# Patient Record
Sex: Male | Born: 1935 | Race: White | Hispanic: No | Marital: Married | Smoking: Former smoker
Health system: Southern US, Community
[De-identification: ages and names within clinical notes are randomized; demographics above are authoritative.]

## PROBLEM LIST (undated history)

## (undated) DIAGNOSIS — N2581 Secondary hyperparathyroidism of renal origin: Secondary | ICD-10-CM

## (undated) DIAGNOSIS — F419 Anxiety disorder, unspecified: Secondary | ICD-10-CM

## (undated) DIAGNOSIS — E1121 Type 2 diabetes mellitus with diabetic nephropathy: Secondary | ICD-10-CM

## (undated) DIAGNOSIS — E119 Type 2 diabetes mellitus without complications: Secondary | ICD-10-CM

## (undated) DIAGNOSIS — D649 Anemia, unspecified: Secondary | ICD-10-CM

## (undated) DIAGNOSIS — E785 Hyperlipidemia, unspecified: Secondary | ICD-10-CM

## (undated) DIAGNOSIS — I2510 Atherosclerotic heart disease of native coronary artery without angina pectoris: Secondary | ICD-10-CM

## (undated) DIAGNOSIS — N186 End stage renal disease: Secondary | ICD-10-CM

## (undated) DIAGNOSIS — I10 Essential (primary) hypertension: Secondary | ICD-10-CM

## (undated) DIAGNOSIS — I509 Heart failure, unspecified: Secondary | ICD-10-CM

## (undated) DIAGNOSIS — N189 Chronic kidney disease, unspecified: Secondary | ICD-10-CM

## (undated) DIAGNOSIS — D638 Anemia in other chronic diseases classified elsewhere: Secondary | ICD-10-CM

## (undated) HISTORY — PX: OTHER SURGICAL HISTORY: SHX169

## (undated) HISTORY — DX: Type 2 diabetes mellitus without complications: E11.9

## (undated) HISTORY — DX: Essential (primary) hypertension: I10

## (undated) HISTORY — PX: CORONARY ARTERY BYPASS GRAFT: SHX141

## (undated) HISTORY — DX: Hyperlipidemia, unspecified: E78.5

## (undated) HISTORY — DX: Heart failure, unspecified: I50.9

---

## 1995-09-21 DIAGNOSIS — Z951 Presence of aortocoronary bypass graft: Secondary | ICD-10-CM

## 1995-09-21 HISTORY — DX: Presence of aortocoronary bypass graft: Z95.1

## 1995-09-21 HISTORY — PX: CORONARY ARTERY BYPASS GRAFT: SHX141

## 2013-12-06 ENCOUNTER — Ambulatory Visit: Payer: Self-pay | Admitting: Internal Medicine

## 2013-12-21 ENCOUNTER — Ambulatory Visit: Payer: Self-pay | Admitting: Internal Medicine

## 2014-08-30 DIAGNOSIS — E785 Hyperlipidemia, unspecified: Secondary | ICD-10-CM | POA: Insufficient documentation

## 2014-08-30 DIAGNOSIS — R809 Proteinuria, unspecified: Secondary | ICD-10-CM | POA: Insufficient documentation

## 2016-06-11 DIAGNOSIS — N186 End stage renal disease: Secondary | ICD-10-CM | POA: Insufficient documentation

## 2016-06-11 DIAGNOSIS — D638 Anemia in other chronic diseases classified elsewhere: Secondary | ICD-10-CM | POA: Insufficient documentation

## 2016-06-11 DIAGNOSIS — E1121 Type 2 diabetes mellitus with diabetic nephropathy: Secondary | ICD-10-CM | POA: Insufficient documentation

## 2016-06-11 DIAGNOSIS — E1122 Type 2 diabetes mellitus with diabetic chronic kidney disease: Secondary | ICD-10-CM | POA: Insufficient documentation

## 2018-02-14 ENCOUNTER — Encounter (INDEPENDENT_AMBULATORY_CARE_PROVIDER_SITE_OTHER): Payer: Self-pay | Admitting: Vascular Surgery

## 2018-02-14 ENCOUNTER — Other Ambulatory Visit (INDEPENDENT_AMBULATORY_CARE_PROVIDER_SITE_OTHER): Payer: Self-pay | Admitting: Vascular Surgery

## 2018-02-14 ENCOUNTER — Ambulatory Visit (INDEPENDENT_AMBULATORY_CARE_PROVIDER_SITE_OTHER): Payer: Medicare Other | Admitting: Vascular Surgery

## 2018-02-14 ENCOUNTER — Encounter (INDEPENDENT_AMBULATORY_CARE_PROVIDER_SITE_OTHER): Payer: Self-pay

## 2018-02-14 VITALS — BP 195/91 | HR 65 | Resp 17 | Ht 68.0 in | Wt 183.0 lb

## 2018-02-14 DIAGNOSIS — I2510 Atherosclerotic heart disease of native coronary artery without angina pectoris: Secondary | ICD-10-CM | POA: Insufficient documentation

## 2018-02-14 DIAGNOSIS — N189 Chronic kidney disease, unspecified: Secondary | ICD-10-CM

## 2018-02-14 DIAGNOSIS — E785 Hyperlipidemia, unspecified: Secondary | ICD-10-CM

## 2018-02-14 DIAGNOSIS — I10 Essential (primary) hypertension: Secondary | ICD-10-CM | POA: Diagnosis not present

## 2018-02-14 NOTE — Progress Notes (Signed)
Subjective:    Patient ID: Wesley Henderson, male    DOB: [birth year], 84 y.o.   MRN: [id number] Chief Complaint  Patient presents with  . New Patient (Initial Visit)    PD cath placement   Presents as a new patient referred by Dr. Inocente Salles for evaluation of placement of a peritoneal dialysis catheter.  The patient is seen with his wife.  The patient and his wife state that they were told he will need dialysis as soon as possible.  The patient denies any uremic symptoms at this point.  The patient and his wife have decided to proceed with peritoneal dialysis.  The patient states he will start taking peritoneal dialysis classes once the peritoneal dialysis catheter is in place.  The patient denies any recent illness.  The patient denies any recent medication changes.  The patient denies any chest pain or shortness of breath.  The patient denies any abdominal pain, nausea, vomiting or fever.  At this time, the patient is has not started dialysis.   Review of Systems  Constitutional: Negative.   HENT: Negative.   Eyes: Negative.   Respiratory: Negative.   Cardiovascular: Negative.   Gastrointestinal: Negative.   Endocrine: Negative.   Genitourinary:       Chronic kidney disease  Musculoskeletal: Negative.   Skin: Negative.   Allergic/Immunologic: Negative.   Neurological: Negative.   Hematological: Negative.   Psychiatric/Behavioral: Negative.       Objective:   Physical Exam  Constitutional: He is oriented to person, place, and time. He appears well-developed. No distress.  HENT:  Head: Normocephalic and atraumatic.  Eyes: Conjunctivae are normal. Pupils are equal, round, and reactive to light.  Neck: Normal range of motion.  Cardiovascular: Normal rate, regular rhythm, normal heart sounds and intact distal pulses.  Pulses:      Radial pulses are 2+ on the right side, and 2+ on the left side.       Dorsalis pedis pulses are 2+ on the right side, and 2+ on the left side.   Posterior tibial pulses are 2+ on the right side, and 2+ on the left side.  Pulmonary/Chest: Effort normal and breath sounds normal. No respiratory distress. He has no wheezes. He has no rales.  Abdominal: Soft. Bowel sounds are normal. He exhibits no distension. There is no tenderness. There is no rebound.  Musculoskeletal: Normal range of motion. He exhibits no edema.  Neurological: He is alert and oriented to person, place, and time.  Skin: Skin is warm and dry. He is not diaphoretic.  Psychiatric: He has a normal mood and affect. His behavior is normal. Judgment and thought content normal.  Vitals reviewed.  BP (!) 195/91 (BP Location: Left Arm, Patient Position: Sitting)   Pulse 65   Resp 17   Ht 5\' 8"  (1.727 m)   Wt 183 lb (83 kg)   BMI 27.83 kg/m   Past Medical History:  Diagnosis Date  . CHF (congestive heart failure) ([place])   . Diabetes mellitus without complication ([place])    Pre-Diabetic  . Hyperlipidemia   . Hypertension    Social History   Socioeconomic History  . Marital status: Married    Spouse name: Not on file  . Number of children: Not on file  . Years of education: Not on file  . Highest education level: Not on file  Social Needs  . Financial resource strain: Not on file  . Food insecurity - worry: Not on file  .  Food insecurity - inability: Not on file  . Transportation needs - medical: Not on file  . Transportation needs - non-medical: Not on file  Occupational History  . Not on file  Tobacco Use  . Smoking status: Never Smoker  . Smokeless tobacco: Never Used  Substance and Sexual Activity  . Alcohol use: No    Frequency: Never  . Drug use: Not on file  . Sexual activity: Not on file  Other Topics Concern  . Not on file  Social History Narrative  . Not on file   Past Surgical History:  Procedure Laterality Date  . heart bypass      History reviewed. No pertinent family history.  No Known Allergies     Assessment & Plan:  Presents as  a new patient referred by Dr. Inocente Salles for evaluation of placement of a peritoneal dialysis catheter.  The patient is seen with his wife.  The patient and his wife state that they were told he will need dialysis as soon as possible.  The patient denies any uremic symptoms at this point.  The patient and his wife have decided to proceed with peritoneal dialysis.  The patient states he will start taking peritoneal dialysis classes once the peritoneal dialysis catheter is in place.  The patient denies any recent illness.  The patient denies any recent medication changes.  The patient denies any chest pain or shortness of breath.  The patient denies any abdominal pain, nausea, vomiting or fever.  At this time, the patient is has not started dialysis.  1. Chronic kidney disease, unspecified CKD stage - New The patient will need a permanent dialysis access in the near future The patient and his wife have decided on proceeding with peritoneal dialysis Procedure, risks and benefits were explained to the patient All questions were answered The patient and his wife are willing to proceed The patient denies any new/recent health issues or illnesses The patient's medication list was updated The patient was a client was unremarkable The patient's medication list was updated  2. Essential hypertension - Stable Encouraged good control as its slows the progression of atherosclerotic disease  3. Hyperlipidemia, unspecified hyperlipidemia type - Stable Encouraged good control as its slows the progression of atherosclerotic disease  Current Outpatient Medications on File Prior to Visit  Medication Sig Dispense Refill  . amLODipine (NORVASC) 5 MG tablet Take by mouth.    . carvedilol (COREG) 12.5 MG tablet Take by mouth.    . Coenzyme Q10 (CO Q-10) 100 MG CAPS Take by mouth.    . ferrous sulfate 325 (65 FE) MG tablet Take by mouth.    . folic acid (FOLVITE) 595 MCG tablet Take by mouth.    . furosemide (LASIX)  40 MG tablet Take by mouth.    . hydrALAZINE (APRESOLINE) 25 MG tablet Take by mouth.    . losartan (COZAAR) 50 MG tablet     . MAGNESIUM PO Take by mouth.    . Omega-3 Fatty Acids (FISH OIL PO) Take by mouth.    . pyridOXINE (VITAMIN B-6) 100 MG tablet Take by mouth.    . RA KRILL OIL 500 MG CAPS Take by mouth.    . Saw Palmetto 450 MG CAPS Take by mouth.    . simvastatin (ZOCOR) 20 MG tablet     . zinc gluconate 50 MG tablet Take by mouth.     No current facility-administered medications on file prior to visit.    There are no  Patient Instructions on file for this visit. No Follow-up on file.  Tandrea Kommer A Ashli Selders, PA-C

## 2018-02-21 ENCOUNTER — Telehealth (INDEPENDENT_AMBULATORY_CARE_PROVIDER_SITE_OTHER): Payer: Self-pay

## 2018-02-21 NOTE — Telephone Encounter (Signed)
Patient's wife called stating that the patient has "white coat syndrome", the patient has high blood pressure and takes medication for this. The patient has a surgery coming up and is concerned with being put to sleep due to his bp being high. I explained that he may need to contact his PCP regarding his BP and that they always check BP before surgery as well as at his pre-op.

## 2018-02-25 ENCOUNTER — Encounter
Admission: RE | Admit: 2018-02-25 | Discharge: 2018-02-25 | Disposition: A | Discharge status: Home / Self Care | Payer: Medicare Other | Source: Ambulatory Visit | Attending: Vascular Surgery | Admitting: Vascular Surgery

## 2018-02-25 ENCOUNTER — Other Ambulatory Visit: Payer: Self-pay

## 2018-02-25 DIAGNOSIS — I4510 Unspecified right bundle-branch block: Secondary | ICD-10-CM | POA: Diagnosis not present

## 2018-02-25 DIAGNOSIS — Z01818 Encounter for other preprocedural examination: Secondary | ICD-10-CM | POA: Insufficient documentation

## 2018-02-25 DIAGNOSIS — N186 End stage renal disease: Secondary | ICD-10-CM | POA: Insufficient documentation

## 2018-02-25 DIAGNOSIS — R9431 Abnormal electrocardiogram [ECG] [EKG]: Secondary | ICD-10-CM | POA: Diagnosis not present

## 2018-02-25 HISTORY — DX: Chronic kidney disease, unspecified: N18.9

## 2018-02-25 HISTORY — DX: Anxiety disorder, unspecified: F41.9

## 2018-02-25 HISTORY — DX: Anemia, unspecified: D64.9

## 2018-02-25 LAB — PROTIME-INR
INR: 1.05
PROTHROMBIN TIME: 13.6 s (ref 11.4–15.2)

## 2018-02-25 LAB — APTT: APTT: 31 s (ref 24–36)

## 2018-02-25 NOTE — Pre-Procedure Instructions (Addendum)
Previous RBBB noted on this EKG below which is more recent than the EKG used to compare for today 02/25/18.      ECG 12-lead9/22/2016 [place] Component Name Value Ref Range  Vent Rate (bpm) 55   PR Interval (msec) 216   QRS Interval (msec) 148   QT Interval (msec) 462   QTc (msec) 441   Other Result Information  This result has an attachment that is not available.  Result Narrative  Sinus bradycardia 1st degree AV block Right bundle branch block Possible Inferior infarct , age undetermined Abnormal ECG No previous ECGs available I reviewed and concur with this report. Electronically signed IF:BPPHKF MD, Hewitt Blade [phone]) on 09/20/2015 8:45:51 AM

## 2018-02-25 NOTE — Pre-Procedure Instructions (Addendum)
PATIENT JUST HAD LABS DRAWN IN January NOTED IN CARE EVERYWHERE. DID NOT REPEAT BMP OR CBC. NOTIFIED STAFF AT [facility], message left on nurses line.

## 2018-02-25 NOTE — Patient Instructions (Signed)
Your procedure is scheduled on: Friday 03/04/18 Report to [place]. To find out your arrival time please call [phone] between 1PM - 3PM on Thursday 03/03/18.  Remember: Instructions that are not followed completely may result in serious medical risk, up to and including death, or upon the discretion of your surgeon and anesthesiologist your surgery may need to be rescheduled.     _X__ 1. Do not eat food after midnight the night before your procedure.                 No gum chewing or hard candies. You may drink clear liquids up to 2 hours                 before you are scheduled to arrive for your surgery- DO not drink clear                 liquids within 2 hours of the start of your surgery.                 Clear Liquids include:  water, apple juice without pulp, clear carbohydrate                 drink such as Clearfast or Gatorade, Black Coffee or Tea (Do not add                 anything to coffee or tea).  __X__2.  On the morning of surgery brush your teeth with toothpaste and water, you                 may rinse your mouth with mouthwash if you wish.  Do not swallow any              toothpaste of mouthwash.     _X__ 3.  No Alcohol for 24 hours before or after surgery.   _X__ 4.  Do Not Smoke or use e-cigarettes For 24 Hours Prior to Your Surgery.                 Do not use any chewable tobacco products for at least 6 hours prior to                 surgery.  ____  5.  Bring all medications with you on the day of surgery if instructed.   __X__  6.  Notify your doctor if there is any change in your medical condition      (cold, fever, infections).     Do not wear jewelry, make-up, hairpins, clips or nail polish. Do not wear lotions, powders, or perfumes.  Do not shave 48 hours prior to surgery. Men may shave face and neck. Do not bring valuables to the hospital.    [facility] is not responsible for any belongings or  valuables.  Contacts, dentures/partials or body piercings may not be worn into surgery. Bring a case for your contacts, glasses or hearing aids, a denture cup will be supplied. Leave your suitcase in the car. After surgery it may be brought to your room. For patients admitted to the hospital, discharge time is determined by your treatment team.   Patients discharged the day of surgery will not be allowed to drive home.   Please read over the following fact sheets that you were given:   MRSA Information  __X__ Take these medicines the morning of surgery with A SIP OF WATER:  1. AMLODIPINE  2. CARVEDILOL  3.   4.  5.  6. YOU MAY CONTINUE ASPIRIN UP UNTIL THE DAY BEFORE SURGERY  ____ Fleet Enema (as directed)   __X__ Use CHG Soap/SAGE wipes as directed  ____ Use inhalers on the day of surgery  ____ Stop metformin/Janumet/Farxiga 2 days prior to surgery    ____ Take 1/2 of usual insulin dose the night before surgery. No insulin the morning          of surgery.   ____ Stop Blood Thinners Coumadin/Plavix/Xarelto/Pleta/Pradaxa/Eliquis/Effient/Aspirin  on   Or contact your Surgeon, Cardiologist or Medical Doctor regarding  ability to stop your blood thinners  __X__ Stop Anti-inflammatories 7 days before surgery such as Advil, Ibuprofen, Motrin,  BC or Goodies Powder, Naprosyn, Naproxen, Aleve, Aspirin  YOU MAY TAKE TYLENOL   __X__ Stop all herbal supplements, fish oil or vitamin E until after surgery.  STOP CO Q 10 AND FISH OIL TODAY  ____ Bring C-Pap to the hospital.

## 2018-02-26 LAB — TYPE AND SCREEN
ABO/RH(D): O NEG
Antibody Screen: NEGATIVE

## 2018-03-03 MED ORDER — CEFAZOLIN SODIUM-DEXTROSE 2-4 GM/100ML-% IV SOLN
2.0000 g | INTRAVENOUS | Status: AC
  Administered 2018-03-04: 2 g via INTRAVENOUS

## 2018-03-04 ENCOUNTER — Ambulatory Visit: Payer: Medicare Other | Admitting: Anesthesiology

## 2018-03-04 ENCOUNTER — Ambulatory Visit
Admission: RE | Admit: 2018-03-04 | Discharge: 2018-03-04 | Disposition: A | Discharge status: Home / Self Care | Payer: Medicare Other | Source: Ambulatory Visit | Attending: Vascular Surgery | Admitting: Vascular Surgery

## 2018-03-04 ENCOUNTER — Encounter
Admission: RE | Disposition: A | Discharge status: Home / Self Care | Payer: Self-pay | Source: Ambulatory Visit | Attending: Vascular Surgery

## 2018-03-04 ENCOUNTER — Encounter: Payer: Self-pay | Admitting: *Deleted

## 2018-03-04 DIAGNOSIS — I2510 Atherosclerotic heart disease of native coronary artery without angina pectoris: Secondary | ICD-10-CM | POA: Insufficient documentation

## 2018-03-04 DIAGNOSIS — E785 Hyperlipidemia, unspecified: Secondary | ICD-10-CM | POA: Diagnosis not present

## 2018-03-04 DIAGNOSIS — Z888 Allergy status to other drugs, medicaments and biological substances status: Secondary | ICD-10-CM | POA: Diagnosis not present

## 2018-03-04 DIAGNOSIS — N186 End stage renal disease: Secondary | ICD-10-CM

## 2018-03-04 DIAGNOSIS — Z7982 Long term (current) use of aspirin: Secondary | ICD-10-CM | POA: Diagnosis not present

## 2018-03-04 DIAGNOSIS — F419 Anxiety disorder, unspecified: Secondary | ICD-10-CM | POA: Diagnosis not present

## 2018-03-04 DIAGNOSIS — I132 Hypertensive heart and chronic kidney disease with heart failure and with stage 5 chronic kidney disease, or end stage renal disease: Secondary | ICD-10-CM | POA: Diagnosis not present

## 2018-03-04 DIAGNOSIS — E1122 Type 2 diabetes mellitus with diabetic chronic kidney disease: Secondary | ICD-10-CM | POA: Diagnosis present

## 2018-03-04 DIAGNOSIS — I509 Heart failure, unspecified: Secondary | ICD-10-CM | POA: Diagnosis not present

## 2018-03-04 DIAGNOSIS — Z79899 Other long term (current) drug therapy: Secondary | ICD-10-CM | POA: Insufficient documentation

## 2018-03-04 HISTORY — PX: CAPD INSERTION: SHX5233

## 2018-03-04 LAB — POCT I-STAT 4, (NA,K, GLUC, HGB,HCT)
Glucose, Bld: 127 mg/dL — ABNORMAL HIGH (ref 65–99)
HEMATOCRIT: 24 % — AB (ref 39.0–52.0)
Hemoglobin: 8.2 g/dL — ABNORMAL LOW (ref 13.0–17.0)
Potassium: 4.5 mmol/L (ref 3.5–5.1)
SODIUM: 140 mmol/L (ref 135–145)

## 2018-03-04 LAB — GLUCOSE, CAPILLARY: GLUCOSE-CAPILLARY: 116 mg/dL — AB (ref 65–99)

## 2018-03-04 SURGERY — LAPAROSCOPIC INSERTION CONTINUOUS AMBULATORY PERITONEAL DIALYSIS  (CAPD) CATHETER
Anesthesia: General | Wound class: Clean

## 2018-03-04 MED ORDER — FENTANYL CITRATE (PF) 100 MCG/2ML IJ SOLN
INTRAMUSCULAR | Status: DC | PRN
  Administered 2018-03-04: 100 ug via INTRAVENOUS

## 2018-03-04 MED ORDER — SODIUM CHLORIDE 0.9 % IV SOLN
INTRAVENOUS | Status: DC
  Administered 2018-03-04: 07:00:00 via INTRAVENOUS

## 2018-03-04 MED ORDER — FENTANYL CITRATE (PF) 100 MCG/2ML IJ SOLN: 25.0000 ug | INTRAMUSCULAR | Status: DC | PRN

## 2018-03-04 MED ORDER — PROMETHAZINE HCL 25 MG/ML IJ SOLN: 6.2500 mg | INTRAMUSCULAR | Status: DC | PRN

## 2018-03-04 MED ORDER — SUGAMMADEX SODIUM 200 MG/2ML IV SOLN
INTRAVENOUS | Status: AC
  Filled 2018-03-04: qty 2

## 2018-03-04 MED ORDER — PHENYLEPHRINE HCL 10 MG/ML IJ SOLN
INTRAMUSCULAR | Status: AC
  Filled 2018-03-04: qty 1

## 2018-03-04 MED ORDER — CHLORHEXIDINE GLUCONATE CLOTH 2 % EX PADS: 6.0000 | MEDICATED_PAD | Freq: Once | CUTANEOUS | Status: DC

## 2018-03-04 MED ORDER — PROPOFOL 10 MG/ML IV BOLUS
INTRAVENOUS | Status: DC | PRN
  Administered 2018-03-04: 130 mg via INTRAVENOUS

## 2018-03-04 MED ORDER — SUGAMMADEX SODIUM 200 MG/2ML IV SOLN
INTRAVENOUS | Status: DC | PRN
  Administered 2018-03-04: 170 mg via INTRAVENOUS

## 2018-03-04 MED ORDER — OXYCODONE HCL 5 MG PO TABS: 5.0000 mg | ORAL_TABLET | Freq: Once | ORAL | Status: DC | PRN

## 2018-03-04 MED ORDER — OXYCODONE HCL 5 MG/5ML PO SOLN: 5.0000 mg | Freq: Once | ORAL | Status: DC | PRN

## 2018-03-04 MED ORDER — PROPOFOL 10 MG/ML IV BOLUS
INTRAVENOUS | Status: AC
  Filled 2018-03-04: qty 20

## 2018-03-04 MED ORDER — CEFAZOLIN SODIUM-DEXTROSE 2-4 GM/100ML-% IV SOLN
INTRAVENOUS | Status: AC
  Filled 2018-03-04: qty 100

## 2018-03-04 MED ORDER — BUPIVACAINE-EPINEPHRINE (PF) 0.25% -1:200000 IJ SOLN
INTRAMUSCULAR | Status: DC | PRN
  Administered 2018-03-04: 20 mL via PERINEURAL

## 2018-03-04 MED ORDER — SEVOFLURANE IN SOLN
RESPIRATORY_TRACT | Status: AC
  Filled 2018-03-04: qty 250

## 2018-03-04 MED ORDER — ONDANSETRON HCL 4 MG/2ML IJ SOLN
INTRAMUSCULAR | Status: AC
  Filled 2018-03-04: qty 2

## 2018-03-04 MED ORDER — ONDANSETRON HCL 4 MG/2ML IJ SOLN
INTRAMUSCULAR | Status: DC | PRN
  Administered 2018-03-04: 4 mg via INTRAVENOUS

## 2018-03-04 MED ORDER — HYDROCODONE-ACETAMINOPHEN 5-325 MG PO TABS: 1.0000 | ORAL_TABLET | Freq: Four times a day (QID) | ORAL | 0 refills | Status: DC | PRN

## 2018-03-04 MED ORDER — EPHEDRINE SULFATE 50 MG/ML IJ SOLN
INTRAMUSCULAR | Status: DC | PRN
  Administered 2018-03-04: 10 mg via INTRAVENOUS

## 2018-03-04 MED ORDER — FAMOTIDINE 20 MG PO TABS
ORAL_TABLET | ORAL | Status: AC
  Administered 2018-03-04: 20 mg via ORAL
  Filled 2018-03-04: qty 1

## 2018-03-04 MED ORDER — EPHEDRINE SULFATE 50 MG/ML IJ SOLN
INTRAMUSCULAR | Status: AC
  Filled 2018-03-04: qty 1

## 2018-03-04 MED ORDER — FENTANYL CITRATE (PF) 100 MCG/2ML IJ SOLN
INTRAMUSCULAR | Status: AC
  Filled 2018-03-04: qty 2

## 2018-03-04 MED ORDER — FAMOTIDINE 20 MG PO TABS
20.0000 mg | ORAL_TABLET | Freq: Once | ORAL | Status: AC
  Administered 2018-03-04: 20 mg via ORAL

## 2018-03-04 MED ORDER — ROCURONIUM BROMIDE 100 MG/10ML IV SOLN
INTRAVENOUS | Status: DC | PRN
  Administered 2018-03-04: 20 mg via INTRAVENOUS
  Administered 2018-03-04: 30 mg via INTRAVENOUS

## 2018-03-04 MED ORDER — MEPERIDINE HCL 50 MG/ML IJ SOLN: 6.2500 mg | INTRAMUSCULAR | Status: DC | PRN

## 2018-03-04 MED ORDER — BUPIVACAINE-EPINEPHRINE (PF) 0.25% -1:200000 IJ SOLN
INTRAMUSCULAR | Status: AC
  Filled 2018-03-04: qty 30

## 2018-03-04 SURGICAL SUPPLY — 40 items
ADAPTER BETA CAP QUINTON DIALY (ADAPTER) ×3 IMPLANT
ADAPTER CATH DIALYSIS 18.75 (CATHETERS) ×2 IMPLANT
ADAPTER CATH DIALYSIS 18.75CM (CATHETERS) ×1
BLADE SURG 15 STRL LF DISP TIS (BLADE) ×1 IMPLANT
BLADE SURG 15 STRL SS (BLADE) ×2
CANISTER SUCT 1200ML W/VALVE (MISCELLANEOUS) ×3 IMPLANT
CATH DLYS SWAN NECK 62.5CM (CATHETERS) ×3 IMPLANT
CHLORAPREP W/TINT 26ML (MISCELLANEOUS) ×3 IMPLANT
DERMABOND ADVANCED (GAUZE/BANDAGES/DRESSINGS) ×2
DERMABOND ADVANCED .7 DNX12 (GAUZE/BANDAGES/DRESSINGS) ×1 IMPLANT
ELECT REM PT RETURN 9FT ADLT (ELECTROSURGICAL) ×3
ELECTRODE REM PT RTRN 9FT ADLT (ELECTROSURGICAL) ×1 IMPLANT
GAUZE SPONGE 4X4 12PLY STRL (GAUZE/BANDAGES/DRESSINGS) ×3 IMPLANT
GLOVE BIO SURGEON STRL SZ7 (GLOVE) ×3 IMPLANT
GLOVE INDICATOR 7.5 STRL GRN (GLOVE) ×3 IMPLANT
GLOVE SURG SYN 8.0 (GLOVE) ×3 IMPLANT
GOWN STRL REUS W/ TWL LRG LVL3 (GOWN DISPOSABLE) ×2 IMPLANT
GOWN STRL REUS W/ TWL XL LVL3 (GOWN DISPOSABLE) ×1 IMPLANT
GOWN STRL REUS W/TWL LRG LVL3 (GOWN DISPOSABLE) ×4
GOWN STRL REUS W/TWL XL LVL3 (GOWN DISPOSABLE) ×2
KIT TURNOVER KIT A (KITS) ×3 IMPLANT
LABEL OR SOLS (LABEL) ×3 IMPLANT
MINICAP W/POVIDONE IODINE SOL (MISCELLANEOUS) ×3 IMPLANT
NEEDLE HYPO 25X1 1.5 SAFETY (NEEDLE) ×3 IMPLANT
NEEDLE INSUFFLATION 150MM (ENDOMECHANICALS) ×3 IMPLANT
NS IRRIG 500ML POUR BTL (IV SOLUTION) ×3 IMPLANT
PACK LAP CHOLECYSTECTOMY (MISCELLANEOUS) ×3 IMPLANT
PENCIL ELECTRO HAND CTR (MISCELLANEOUS) ×3 IMPLANT
SET TRANSFER 6 W/TWIST CLAMP 5 (SET/KITS/TRAYS/PACK) ×3 IMPLANT
SLEEVE ENDOPATH XCEL 5M (ENDOMECHANICALS) ×3 IMPLANT
SUT MNCRL 4-0 (SUTURE) ×4
SUT MNCRL 4-0 27XMFL (SUTURE) ×2
SUT MNCRL AB 4-0 PS2 18 (SUTURE) ×3 IMPLANT
SUT VIC AB 3-0 SH 27 (SUTURE) ×4
SUT VIC AB 3-0 SH 27X BRD (SUTURE) ×2 IMPLANT
SUT VICRYL 0 AB UR-6 (SUTURE) ×3 IMPLANT
SUTURE MNCRL 4-0 27XMF (SUTURE) ×2 IMPLANT
SYR 50ML LL SCALE MARK (SYRINGE) ×3 IMPLANT
TROCAR XCEL NON-BLD 5MMX100MML (ENDOMECHANICALS) ×3 IMPLANT
TUBING INSUFFLATION (TUBING) ×3 IMPLANT

## 2018-03-04 NOTE — Anesthesia Postprocedure Evaluation (Signed)
Anesthesia Post Note  Patient: SIRIS HOOS  Procedure(s) Performed: LAPAROSCOPIC INSERTION CONTINUOUS AMBULATORY PERITONEAL DIALYSIS  (CAPD) CATHETER (N/A )  Patient location during evaluation: PACU Anesthesia Type: General Level of consciousness: awake and alert and oriented Pain management: pain level controlled Vital Signs Assessment: post-procedure vital signs reviewed and stable Respiratory status: spontaneous breathing, nonlabored ventilation and respiratory function stable Cardiovascular status: blood pressure returned to baseline and stable Postop Assessment: no signs of nausea or vomiting Anesthetic complications: no     Last Vitals:  Vitals:   03/04/18 0938 03/04/18 0948  BP: (!) 147/73 (!) 164/75  Pulse: (!) 56 (!) 55  Resp: (!) 24 16  Temp: (!) 36.3 C (!) 36.2 C  SpO2: 99% 100%    Last Pain:  Vitals:   03/04/18 0948  TempSrc: Temporal  PainSc:                  Tharon Bomar

## 2018-03-04 NOTE — Discharge Instructions (Signed)

## 2018-03-04 NOTE — Anesthesia Preprocedure Evaluation (Signed)
Anesthesia Evaluation  Patient identified by MRN, date of birth, ID band Patient awake    Reviewed: Allergy & Precautions, NPO status , Patient's Chart, lab work & pertinent test results  History of Anesthesia Complications Negative for: history of anesthetic complications  Airway Mallampati: III  TM Distance: >3 FB Neck ROM: Full    Dental  (+) Loose, Poor Dentition   Pulmonary neg pulmonary ROS, neg sleep apnea, neg COPD,    breath sounds clear to auscultation- rhonchi (-) wheezing      Cardiovascular hypertension, Pt. on medications + CAD and +CHF  (-) Cardiac Stents and (-) CABG  Rhythm:Regular Rate:Normal - Systolic murmurs and - Diastolic murmurs    Neuro/Psych Anxiety negative neurological ROS     GI/Hepatic negative GI ROS, Neg liver ROS,   Endo/Other  diabetes  Renal/GU CRFRenal disease     Musculoskeletal negative musculoskeletal ROS (+)   Abdominal (+) - obese,   Peds  Hematology  (+) anemia ,   Anesthesia Other Findings Past Medical History: No date: Anemia No date: Anxiety No date: CHF (congestive heart failure) (HCC) No date: Chronic kidney disease No date: Diabetes mellitus without complication (HCC)     Comment:  Pre-Diabetic No date: Hyperlipidemia No date: Hypertension   Reproductive/Obstetrics                             Anesthesia Physical Anesthesia Plan  ASA: III  Anesthesia Plan: General   Post-op Pain Management:    Induction: Intravenous  PONV Risk Score and Plan: 1 and Ondansetron  Airway Management Planned: Oral ETT  Additional Equipment:   Intra-op Plan:   Post-operative Plan: Extubation in OR  Informed Consent: I have reviewed the patients History and Physical, chart, labs and discussed the procedure including the risks, benefits and alternatives for the proposed anesthesia with the patient or authorized representative who has indicated  his/her understanding and acceptance.   Dental advisory given  Plan Discussed with: CRNA and Anesthesiologist  Anesthesia Plan Comments:         Anesthesia Quick Evaluation

## 2018-03-04 NOTE — Transfer of Care (Signed)
Immediate Anesthesia Transfer of Care Note  Patient: Wesley Henderson  Procedure(s) Performed: LAPAROSCOPIC INSERTION CONTINUOUS AMBULATORY PERITONEAL DIALYSIS  (CAPD) CATHETER (N/A )  Patient Location: PACU  Anesthesia Type:General  Level of Consciousness: drowsy and patient cooperative  Airway & Oxygen Therapy: Patient Spontanous Breathing and Patient connected to face mask oxygen  Post-op Assessment: Report given to RN and Post -op Vital signs reviewed and stable  Post vital signs: Reviewed and stable  Last Vitals:  Vitals:   03/04/18 0616 03/04/18 0911  BP: (!) 185/80 (!) 149/67  Pulse: 63 (!) 53  Resp: 20 12  Temp: 36.5 C   SpO2: 100% 100%    Last Pain:  Vitals:   03/04/18 0616  TempSrc: Oral         Complications: No apparent anesthesia complications

## 2018-03-04 NOTE — H&P (Signed)
[facility] History & Physical Update  The patient was interviewed and re-examined.  The patient's previous History and Physical has been reviewed and is unchanged.  There is no change in the plan of care. We plan to proceed with the scheduled procedure.  Hortencia Pilar, MD  03/04/2018, 7:34 AM

## 2018-03-04 NOTE — Anesthesia Post-op Follow-up Note (Signed)
Anesthesia QCDR form completed.        

## 2018-03-04 NOTE — Anesthesia Procedure Notes (Signed)
Procedure Name: Intubation Date/Time: 03/04/2018 7:50 AM Performed by: Jonna Clark, CRNA Pre-anesthesia Checklist: Patient identified, Patient being monitored, Timeout performed, Emergency Drugs available and Suction available Patient Re-evaluated:Patient Re-evaluated prior to induction Oxygen Delivery Method: Circle system utilized Preoxygenation: Pre-oxygenation with 100% oxygen Induction Type: IV induction Ventilation: Mask ventilation without difficulty Laryngoscope Size: Miller and 2 Grade View: Grade I Tube type: Oral Tube size: 7.5 mm Number of attempts: 1 Placement Confirmation: ETT inserted through vocal cords under direct vision,  positive ETCO2 and breath sounds checked- equal and bilateral Secured at: 21 cm Tube secured with: Tape Dental Injury: Teeth and Oropharynx as per pre-operative assessment

## 2018-03-04 NOTE — Op Note (Signed)
  OPERATIVE NOTE   PROCEDURE: 1. Laparoscopic peritoneal dialysis catheter placement.  PRE-OPERATIVE DIAGNOSIS: 1. End stage renal disease 2. hypertension  POST-OPERATIVE DIAGNOSIS: Same  SURGEON: Hortencia Pilar  ASSISTANT(S): None  ANESTHESIA: general  ESTIMATED BLOOD LOSS: Minimal   FINDING(S): 1. None  SPECIMEN(S): None  INDICATIONS:  Wesley Henderson is a 84 y.o. male who presents with ESRD. The patient has decided to do peritoneal dialysis for his long-term dialysis. Risks and benefits of placement were discussed and he is agreeable to proceed.  Differences between peritoneal dialysis and hemodialysis were discussed.    DESCRIPTION:  After obtaining full informed written consent, the patient was brought back to the operating room and placed supine upon the operating table. The patient received IV antibiotics prior to induction. After obtaining adequate anesthesia, the abdomen was prepped and draped in the standard fashion.   A small vertical incision was made in the midline just above the umbilicus and the dissection was carried down through the soft tissue to expose the fascia. Two 0 Vicryl sutures were then used to secure the fascia just lateral to the midline on both sides and an 15 blade was used to incise the fascia. The fascia is then elevated with a bone hook Varies needle is introduced and a saline test was performed indicating intraperitoneal location and insufflated of the abdomen with carbon dioxide is performed to 15 mmHg pressure. A 5 mm trocar is then placed again maintaining elevation of the fascia with a bone hook.  A oblique incision is then made in the left lower quadrant to the lateral portion of the rectus muscle the dissection is carried down through the soft tissues and the fascia is exposed. Small incision is made in the fascia with the Bovie and a pursestring suture of 0 Vicryl is placed. Upon initial attempt of placing the Seldinger needle the  adhesions are obstructing the catheter positioning and view of the placement and therefore they must be removed.  Seldinger needle was then inserted under direct visualization and the wire introduced under the view of the camera trocar was then placed and the catheter was advanced into the abdominal cavity over the trocar. Under direct visualization the catheters curled portion was positioned deep into the pelvis just to the right of midline. It was irrigated with heparinized saline. The deep cuff was secured to the fascial pursestring suture. A small counterincision was made in the right upper quadrant of the abdomen and the catheter was brought out this site. The appropriate distal connectors were placed, and I then placed 300 cc of saline through the catheter into the pelvis. The abdomen was desufflated. Immediately, 250 cc of effluent returned through the catheter when the bag was placed to gravity. I took one more look with the camera to ensure that the catheter was in the pelvis and it was. The hasson trocar was then removed. I then closed the incisions with a 2-0 Vicryl in the right lower quadrant incision and subsequently 3-0 Vicryl and 4-0 Monocryl, the other incisions were closed with 3-0 Vicryl and 4-0 Monocryl and placed Dermabond as dressing. Dry dressing was placed around the catheter exit site. The patient was then awakened from anesthesia and taken to the recovery room in stable condition having tolerated the procedure well.   COMPLICATIONS: None  CONDITION: None  Hortencia Pilar 03/04/2018, 9:03 AM

## 2018-03-14 ENCOUNTER — Encounter (INDEPENDENT_AMBULATORY_CARE_PROVIDER_SITE_OTHER): Payer: Self-pay | Admitting: Vascular Surgery

## 2018-03-14 ENCOUNTER — Ambulatory Visit (INDEPENDENT_AMBULATORY_CARE_PROVIDER_SITE_OTHER): Payer: Medicare Other | Admitting: Vascular Surgery

## 2018-03-14 VITALS — BP 192/93 | HR 67 | Resp 16 | Ht 68.0 in | Wt 183.0 lb

## 2018-03-14 DIAGNOSIS — T829XXS Unspecified complication of cardiac and vascular prosthetic device, implant and graft, sequela: Secondary | ICD-10-CM

## 2018-03-14 DIAGNOSIS — N185 Chronic kidney disease, stage 5: Secondary | ICD-10-CM

## 2018-03-14 DIAGNOSIS — I10 Essential (primary) hypertension: Secondary | ICD-10-CM

## 2018-03-14 DIAGNOSIS — I25118 Atherosclerotic heart disease of native coronary artery with other forms of angina pectoris: Secondary | ICD-10-CM

## 2018-03-15 ENCOUNTER — Ambulatory Visit
Admission: RE | Admit: 2018-03-15 | Discharge: 2018-03-15 | Disposition: A | Discharge status: Home / Self Care | Payer: Medicare Other | Source: Ambulatory Visit | Attending: Vascular Surgery | Admitting: Vascular Surgery

## 2018-03-15 DIAGNOSIS — T829XXS Unspecified complication of cardiac and vascular prosthetic device, implant and graft, sequela: Secondary | ICD-10-CM | POA: Insufficient documentation

## 2018-03-15 DIAGNOSIS — X58XXXS Exposure to other specified factors, sequela: Secondary | ICD-10-CM | POA: Insufficient documentation

## 2018-03-21 ENCOUNTER — Ambulatory Visit (INDEPENDENT_AMBULATORY_CARE_PROVIDER_SITE_OTHER): Payer: Medicare Other | Admitting: Vascular Surgery

## 2018-03-25 ENCOUNTER — Telehealth (INDEPENDENT_AMBULATORY_CARE_PROVIDER_SITE_OTHER): Payer: Self-pay

## 2018-03-25 NOTE — Telephone Encounter (Signed)
Wesley Henderson from [facility] called stating that the patient needs a PD cath revision  She has now sent over the orders for this procedure and we are working on getting the patient scheduled.

## 2018-03-26 ENCOUNTER — Encounter (INDEPENDENT_AMBULATORY_CARE_PROVIDER_SITE_OTHER): Payer: Self-pay | Admitting: Vascular Surgery

## 2018-03-26 NOTE — Progress Notes (Signed)
Patient ID: Wesley Henderson, male   DOB: 09-21-1930, 84 y.o.   MRN: [id number]  Chief Complaint  Patient presents with  . Follow-up    check dialysis catheter    HPI Wesley Henderson is a 84 y.o. male.  The patient is reporting that his PD catheter is no longer draining.  It is been working fine post surgery but just a few days ago stopped draining.  He denies fever chills.  No tenderness at the insertion site.   Past Medical History:  Diagnosis Date  . Anemia   . Anxiety   . CHF (congestive heart failure) ([place])   . Chronic kidney disease   . Diabetes mellitus without complication ([place])    Pre-Diabetic  . Hyperlipidemia   . Hypertension     Past Surgical History:  Procedure Laterality Date  . CAPD INSERTION N/A 03/04/2018   Procedure: LAPAROSCOPIC INSERTION CONTINUOUS AMBULATORY PERITONEAL DIALYSIS  (CAPD) CATHETER;  Surgeon: Katha Cabal, MD;  Location: [facility] ORS;  Service: Vascular;  Laterality: N/A;  . CORONARY ARTERY BYPASS GRAFT     Oct 1996  . heart bypass        Allergies  Allergen Reactions  . Hydralazine Swelling    Swelling hands feet and ankles    Current Outpatient Medications  Medication Sig Dispense Refill  . amLODipine (NORVASC) 5 MG tablet Take 5 mg by mouth daily.     . carvedilol (COREG) 12.5 MG tablet Take 12.5 mg by mouth 2 (two) times daily with a meal.     . furosemide (LASIX) 40 MG tablet Take 40 mg by mouth daily.     . patiromer (VELTASSA) 8.4 g packet Take 8.4 g by mouth daily.    . simvastatin (ZOCOR) 20 MG tablet Take 20 mg by mouth at bedtime.     Marland Kitchen telmisartan (MICARDIS) 80 MG tablet Take 80 mg by mouth daily.    Marland Kitchen aspirin 325 MG EC tablet Take 325 mg by mouth every other day.     . Coenzyme Q10 (CO Q-10) 100 MG CAPS Take 1 capsule by mouth daily.     . ferrous sulfate 325 (65 FE) MG tablet Take 325 mg by mouth daily.     Marland Kitchen HYDROcodone-acetaminophen (NORCO) 5-325 MG tablet Take 1-2 tablets by mouth every 6 (six) hours as  needed. (Patient not taking: Reported on 03/14/2018) 30 tablet 0  . MAGNESIUM PO Take 250 mg by mouth daily.     . Omega-3 Fatty Acids (FISH OIL) 1000 MG CAPS Take 1 capsule by mouth daily. MEGA RED    . pyridOXINE (VITAMIN B-6) 100 MG tablet Take by mouth.     No current facility-administered medications for this visit.         Physical Exam BP (!) 192/93   Pulse 67   Resp 16   Ht 5\' 8"  (1.727 m)   Wt 183 lb (83 kg)   BMI 27.83 kg/m  Gen:  WD/WN, NAD Skin: incisions C/D/I; catheter clean dry and intact     Assessment/Plan: 1. Complication from renal dialysis device, sequela Patient's catheter is no longer functioning.  I will send him for KUB.  Pending results of the study will determine what must be done to fix the catheter. - DG Abd 1 View; Future  2. Stage 5 chronic kidney disease not on chronic dialysis ([place]) Continue to follow with nephrology  3. Essential hypertension Continue antihypertensive medications as already ordered, these medications have been reviewed  and there are no changes at this time.   4. Coronary artery disease of native artery of native heart with stable angina pectoris (HCC) Continue cardiac and antihypertensive medications as already ordered and reviewed, no changes at this time.  Continue statin as ordered and reviewed, no changes at this time  Nitrates PRN for chest pain      No problem-specific Assessment & Plan notes found for this encounter.       Hortencia Pilar 03/26/2018, 4:28 PM   This note was created with Dragon medical transcription system.  Any errors from dictation are unintentional.

## 2018-03-28 ENCOUNTER — Encounter (INDEPENDENT_AMBULATORY_CARE_PROVIDER_SITE_OTHER): Payer: Self-pay

## 2018-03-28 ENCOUNTER — Other Ambulatory Visit (INDEPENDENT_AMBULATORY_CARE_PROVIDER_SITE_OTHER): Payer: Self-pay | Admitting: Vascular Surgery

## 2018-04-01 ENCOUNTER — Encounter
Admission: RE | Admit: 2018-04-01 | Discharge: 2018-04-01 | Disposition: A | Discharge status: Home / Self Care | Payer: Medicare Other | Source: Ambulatory Visit | Attending: Vascular Surgery | Admitting: Vascular Surgery

## 2018-04-01 ENCOUNTER — Other Ambulatory Visit: Payer: Self-pay

## 2018-04-01 DIAGNOSIS — Z01818 Encounter for other preprocedural examination: Secondary | ICD-10-CM | POA: Diagnosis not present

## 2018-04-01 DIAGNOSIS — N186 End stage renal disease: Secondary | ICD-10-CM | POA: Diagnosis not present

## 2018-04-01 LAB — BASIC METABOLIC PANEL
Anion gap: 12 (ref 5–15)
BUN: 52 mg/dL — AB (ref 6–20)
CALCIUM: 8.5 mg/dL — AB (ref 8.9–10.3)
CO2: 19 mmol/L — ABNORMAL LOW (ref 22–32)
CREATININE: 5.93 mg/dL — AB (ref 0.61–1.24)
Chloride: 106 mmol/L (ref 101–111)
GFR calc non Af Amer: 8 mL/min — ABNORMAL LOW (ref >60)
GFR, EST AFRICAN AMERICAN: 9 mL/min — AB (ref >60)
Glucose, Bld: 120 mg/dL — ABNORMAL HIGH (ref 65–99)
Potassium: 3.6 mmol/L (ref 3.5–5.1)
Sodium: 137 mmol/L (ref 135–145)

## 2018-04-01 LAB — CBC WITH DIFFERENTIAL/PLATELET
BASOS PCT: 1 %
Basophils Absolute: 0.1 10*3/uL (ref 0–0.1)
EOS ABS: 1 10*3/uL — AB (ref 0–0.7)
EOS PCT: 14 %
HCT: 26.6 % — ABNORMAL LOW (ref 40.0–52.0)
HEMOGLOBIN: 9.3 g/dL — AB (ref 13.0–18.0)
Lymphocytes Relative: 22 %
Lymphs Abs: 1.5 10*3/uL (ref 1.0–3.6)
MCH: 33.3 pg (ref 26.0–34.0)
MCHC: 35 g/dL (ref 32.0–36.0)
MCV: 95.2 fL (ref 80.0–100.0)
Monocytes Absolute: 0.5 10*3/uL (ref 0.2–1.0)
Monocytes Relative: 7 %
Neutro Abs: 4 10*3/uL (ref 1.4–6.5)
Neutrophils Relative %: 56 %
PLATELETS: 175 10*3/uL (ref 150–440)
RBC: 2.79 MIL/uL — AB (ref 4.40–5.90)
RDW: 14.4 % (ref 11.5–14.5)
WBC: 7.1 10*3/uL (ref 3.8–10.6)

## 2018-04-01 LAB — TYPE AND SCREEN
ABO/RH(D): O NEG
Antibody Screen: NEGATIVE

## 2018-04-01 LAB — PROTIME-INR
INR: 1.07
PROTHROMBIN TIME: 13.8 s (ref 11.4–15.2)

## 2018-04-01 LAB — APTT: APTT: 32 s (ref 24–36)

## 2018-04-01 NOTE — Patient Instructions (Signed)
Your procedure is scheduled on: Wed 04/06/18 Report to [facility]. To find out your arrival time please call [phone] between 1PM - 3PM on Tue 04/05/18.  Remember: Instructions that are not followed completely may result in serious medical risk, up to and including death, or upon the discretion of your surgeon and anesthesiologist your surgery may need to be rescheduled.     _X__ 1. Do not eat food after midnight the night before your procedure.                 No gum chewing or hard candies. You may drink clear liquids up to 2 hours                 before you are scheduled to arrive for your surgery- DO not drink clear                 liquids within 2 hours of the start of your surgery.                 Clear Liquids include:  water, apple juice without pulp, clear carbohydrate                 drink such as Clearfast or Gatorade, Black Coffee or Tea (Do not add                 anything to coffee or tea).  __X__2.  On the morning of surgery brush your teeth with toothpaste and water, you                 may rinse your mouth with mouthwash if you wish.  Do not swallow any              toothpaste of mouthwash.     _X__ 3.  No Alcohol for 24 hours before or after surgery.   _X__ 4.  Do Not Smoke or use e-cigarettes For 24 Hours Prior to Your Surgery.                 Do not use any chewable tobacco products for at least 6 hours prior to                 surgery.  ____  5.  Bring all medications with you on the day of surgery if instructed.   __X__  6.  Notify your doctor if there is any change in your medical condition      (cold, fever, infections).     Do not wear jewelry, make-up, hairpins, clips or nail polish. Do not wear lotions, powders, or perfumes.  Do not shave 48 hours prior to surgery. Men may shave face and neck. Do not bring valuables to the hospital.    [facility] is not responsible for any belongings or  valuables.  Contacts, dentures/partials or body piercings may not be worn into surgery. Bring a case for your contacts, glasses or hearing aids, a denture cup will be supplied. Leave your suitcase in the car. After surgery it may be brought to your room. For patients admitted to the hospital, discharge time is determined by your treatment team.   Patients discharged the day of surgery will not be allowed to drive home.   Please read over the following fact sheets that you were given:   MRSA Information  __X__ Take these medicines the morning of surgery with A SIP OF WATER:  1. amlodipine  2. carvedilol  3.   4.  5.  6.  ____ Fleet Enema (as directed)   __X__ Use CHG Soap/SAGE wipes as directed  ____ Use inhalers on the day of surgery  ____ Stop metformin/Janumet/Farxiga 2 days prior to surgery    ____ Take 1/2 of usual insulin dose the night before surgery. No insulin the morning          of surgery.   ____ Stop Blood Thinners Coumadin/Plavix/Xarelto/Pleta/Pradaxa/Eliquis/Effient/Aspirin  on   Or contact your Surgeon, Cardiologist or Medical Doctor regarding  ability to stop your blood thinners  __X__ Stop Anti-inflammatories 7 days before surgery such as Advil, Ibuprofen, Motrin,  BC or Goodies Powder, Naprosyn, Naproxen, Aleve,  __X__ Stop all herbal supplements, fish oil or vitamin E until after surgery.    ____ Bring C-Pap to the hospital.

## 2018-04-05 MED ORDER — CEFAZOLIN SODIUM-DEXTROSE 2-4 GM/100ML-% IV SOLN
2.0000 g | INTRAVENOUS | Status: AC
  Administered 2018-04-06: 2 g via INTRAVENOUS
  Filled 2018-04-05: qty 100

## 2018-04-06 ENCOUNTER — Encounter
Admission: RE | Disposition: A | Discharge status: Home / Self Care | Payer: Self-pay | Source: Ambulatory Visit | Attending: Vascular Surgery

## 2018-04-06 ENCOUNTER — Ambulatory Visit: Payer: Medicare Other

## 2018-04-06 ENCOUNTER — Ambulatory Visit
Admission: RE | Admit: 2018-04-06 | Discharge: 2018-04-06 | Disposition: A | Discharge status: Home / Self Care | Payer: Medicare Other | Source: Ambulatory Visit | Attending: Vascular Surgery | Admitting: Vascular Surgery

## 2018-04-06 DIAGNOSIS — E785 Hyperlipidemia, unspecified: Secondary | ICD-10-CM | POA: Diagnosis not present

## 2018-04-06 DIAGNOSIS — T82868A Thrombosis of vascular prosthetic devices, implants and grafts, initial encounter: Secondary | ICD-10-CM | POA: Diagnosis not present

## 2018-04-06 DIAGNOSIS — Z79891 Long term (current) use of opiate analgesic: Secondary | ICD-10-CM | POA: Diagnosis not present

## 2018-04-06 DIAGNOSIS — N186 End stage renal disease: Secondary | ICD-10-CM | POA: Diagnosis not present

## 2018-04-06 DIAGNOSIS — D631 Anemia in chronic kidney disease: Secondary | ICD-10-CM | POA: Diagnosis not present

## 2018-04-06 DIAGNOSIS — I132 Hypertensive heart and chronic kidney disease with heart failure and with stage 5 chronic kidney disease, or end stage renal disease: Secondary | ICD-10-CM | POA: Insufficient documentation

## 2018-04-06 DIAGNOSIS — I509 Heart failure, unspecified: Secondary | ICD-10-CM | POA: Diagnosis not present

## 2018-04-06 DIAGNOSIS — Z7982 Long term (current) use of aspirin: Secondary | ICD-10-CM | POA: Insufficient documentation

## 2018-04-06 DIAGNOSIS — Y831 Surgical operation with implant of artificial internal device as the cause of abnormal reaction of the patient, or of later complication, without mention of misadventure at the time of the procedure: Secondary | ICD-10-CM | POA: Insufficient documentation

## 2018-04-06 DIAGNOSIS — I25118 Atherosclerotic heart disease of native coronary artery with other forms of angina pectoris: Secondary | ICD-10-CM | POA: Diagnosis not present

## 2018-04-06 DIAGNOSIS — Z951 Presence of aortocoronary bypass graft: Secondary | ICD-10-CM | POA: Insufficient documentation

## 2018-04-06 DIAGNOSIS — T85611A Breakdown (mechanical) of intraperitoneal dialysis catheter, initial encounter: Secondary | ICD-10-CM | POA: Diagnosis not present

## 2018-04-06 DIAGNOSIS — N185 Chronic kidney disease, stage 5: Secondary | ICD-10-CM | POA: Diagnosis not present

## 2018-04-06 DIAGNOSIS — Z79899 Other long term (current) drug therapy: Secondary | ICD-10-CM | POA: Insufficient documentation

## 2018-04-06 HISTORY — PX: CAPD REVISION: SHX5260

## 2018-04-06 LAB — POCT I-STAT 4, (NA,K, GLUC, HGB,HCT)
Glucose, Bld: 112 mg/dL — ABNORMAL HIGH (ref 65–99)
HEMATOCRIT: 24 % — AB (ref 39.0–52.0)
Hemoglobin: 8.2 g/dL — ABNORMAL LOW (ref 13.0–17.0)
Potassium: 3.5 mmol/L (ref 3.5–5.1)
SODIUM: 140 mmol/L (ref 135–145)

## 2018-04-06 LAB — GLUCOSE, CAPILLARY
GLUCOSE-CAPILLARY: 100 mg/dL — AB (ref 65–99)
Glucose-Capillary: 102 mg/dL — ABNORMAL HIGH (ref 65–99)

## 2018-04-06 SURGERY — LAPAROSCOPIC REVISION CONTINUOUS AMBULATORY PERITONEAL DIALYSIS  (CAPD) CATHETER
Anesthesia: General | Wound class: Clean

## 2018-04-06 MED ORDER — OXYCODONE HCL 5 MG PO TABS: 5.0000 mg | ORAL_TABLET | Freq: Once | ORAL | Status: DC | PRN

## 2018-04-06 MED ORDER — LIDOCAINE HCL (CARDIAC) PF 100 MG/5ML IV SOSY
PREFILLED_SYRINGE | INTRAVENOUS | Status: DC | PRN
  Administered 2018-04-06: 506 mg via INTRAVENOUS

## 2018-04-06 MED ORDER — FAMOTIDINE 20 MG PO TABS
ORAL_TABLET | ORAL | Status: AC
  Administered 2018-04-06: 20 mg via ORAL
  Filled 2018-04-06: qty 1

## 2018-04-06 MED ORDER — DEXAMETHASONE SODIUM PHOSPHATE 10 MG/ML IJ SOLN
INTRAMUSCULAR | Status: AC
  Filled 2018-04-06: qty 1

## 2018-04-06 MED ORDER — FAMOTIDINE 20 MG PO TABS
20.0000 mg | ORAL_TABLET | Freq: Once | ORAL | Status: AC
  Administered 2018-04-06: 20 mg via ORAL

## 2018-04-06 MED ORDER — PROPOFOL 10 MG/ML IV BOLUS
INTRAVENOUS | Status: DC | PRN
  Administered 2018-04-06: 100 mg via INTRAVENOUS

## 2018-04-06 MED ORDER — FENTANYL CITRATE (PF) 100 MCG/2ML IJ SOLN: 25.0000 ug | INTRAMUSCULAR | Status: DC | PRN

## 2018-04-06 MED ORDER — GLYCOPYRROLATE 0.2 MG/ML IJ SOLN
INTRAMUSCULAR | Status: DC | PRN
  Administered 2018-04-06: .2 mg via INTRAVENOUS

## 2018-04-06 MED ORDER — SODIUM CHLORIDE 0.9 % IV SOLN
INTRAVENOUS | Status: DC
  Administered 2018-04-06 (×2): via INTRAVENOUS

## 2018-04-06 MED ORDER — ONDANSETRON HCL 4 MG/2ML IJ SOLN
INTRAMUSCULAR | Status: DC | PRN
  Administered 2018-04-06: 4 mg via INTRAVENOUS

## 2018-04-06 MED ORDER — SUCCINYLCHOLINE CHLORIDE 20 MG/ML IJ SOLN
INTRAMUSCULAR | Status: AC
  Filled 2018-04-06: qty 1

## 2018-04-06 MED ORDER — BUPIVACAINE-EPINEPHRINE (PF) 0.25% -1:200000 IJ SOLN
INTRAMUSCULAR | Status: DC | PRN
  Administered 2018-04-06: 14 mL via PERINEURAL

## 2018-04-06 MED ORDER — BUPIVACAINE-EPINEPHRINE (PF) 0.25% -1:200000 IJ SOLN
INTRAMUSCULAR | Status: AC
  Filled 2018-04-06: qty 30

## 2018-04-06 MED ORDER — CHLORHEXIDINE GLUCONATE CLOTH 2 % EX PADS: 6.0000 | MEDICATED_PAD | Freq: Once | CUTANEOUS | Status: DC

## 2018-04-06 MED ORDER — ROCURONIUM BROMIDE 50 MG/5ML IV SOLN
INTRAVENOUS | Status: AC
  Filled 2018-04-06: qty 1

## 2018-04-06 MED ORDER — SUCCINYLCHOLINE CHLORIDE 20 MG/ML IJ SOLN
INTRAMUSCULAR | Status: DC | PRN
  Administered 2018-04-06: 140 mg via INTRAVENOUS

## 2018-04-06 MED ORDER — ONDANSETRON HCL 4 MG/2ML IJ SOLN
INTRAMUSCULAR | Status: AC
  Filled 2018-04-06: qty 2

## 2018-04-06 MED ORDER — DEXAMETHASONE SODIUM PHOSPHATE 10 MG/ML IJ SOLN
INTRAMUSCULAR | Status: DC | PRN
  Administered 2018-04-06: 5 mg via INTRAVENOUS

## 2018-04-06 MED ORDER — CEFAZOLIN SODIUM-DEXTROSE 2-3 GM-%(50ML) IV SOLR
INTRAVENOUS | Status: AC
  Filled 2018-04-06: qty 50

## 2018-04-06 MED ORDER — ROCURONIUM BROMIDE 100 MG/10ML IV SOLN
INTRAVENOUS | Status: DC | PRN
  Administered 2018-04-06: 20 mg via INTRAVENOUS
  Administered 2018-04-06: 5 mg via INTRAVENOUS

## 2018-04-06 MED ORDER — MIDAZOLAM HCL 2 MG/2ML IJ SOLN
INTRAMUSCULAR | Status: AC
  Filled 2018-04-06: qty 2

## 2018-04-06 MED ORDER — FENTANYL CITRATE (PF) 250 MCG/5ML IJ SOLN
INTRAMUSCULAR | Status: AC
  Filled 2018-04-06: qty 5

## 2018-04-06 MED ORDER — LIDOCAINE HCL (PF) 2 % IJ SOLN
INTRAMUSCULAR | Status: AC
  Filled 2018-04-06: qty 10

## 2018-04-06 MED ORDER — PROPOFOL 10 MG/ML IV BOLUS
INTRAVENOUS | Status: AC
  Filled 2018-04-06: qty 20

## 2018-04-06 MED ORDER — PHENYLEPHRINE HCL 10 MG/ML IJ SOLN
INTRAMUSCULAR | Status: DC | PRN
  Administered 2018-04-06: 100 ug via INTRAVENOUS

## 2018-04-06 MED ORDER — EPHEDRINE SULFATE 50 MG/ML IJ SOLN
INTRAMUSCULAR | Status: DC | PRN
  Administered 2018-04-06 (×3): 5 mg via INTRAVENOUS

## 2018-04-06 MED ORDER — OXYCODONE HCL 5 MG/5ML PO SOLN: 5.0000 mg | Freq: Once | ORAL | Status: DC | PRN

## 2018-04-06 MED ORDER — FENTANYL CITRATE (PF) 100 MCG/2ML IJ SOLN
INTRAMUSCULAR | Status: DC | PRN
  Administered 2018-04-06 (×2): 25 ug via INTRAVENOUS
  Administered 2018-04-06: 50 ug via INTRAVENOUS

## 2018-04-06 MED ORDER — SUGAMMADEX SODIUM 200 MG/2ML IV SOLN
INTRAVENOUS | Status: DC | PRN
  Administered 2018-04-06: 200 mg via INTRAVENOUS

## 2018-04-06 SURGICAL SUPPLY — 36 items
ADAPTER BETA CAP QUINTON DIALY (ADAPTER) IMPLANT
ADAPTER CATH DIALYSIS 18.75 (CATHETERS) IMPLANT
BLADE SURG 15 STRL LF DISP TIS (BLADE) ×1 IMPLANT
BLADE SURG 15 STRL SS (BLADE) ×1
CANISTER SUCT 1200ML W/VALVE (MISCELLANEOUS) ×2 IMPLANT
CATH DLYS SWAN NECK 62.5CM (CATHETERS) IMPLANT
CHLORAPREP W/TINT 26ML (MISCELLANEOUS) ×2 IMPLANT
DERMABOND ADVANCED (GAUZE/BANDAGES/DRESSINGS) ×1
DERMABOND ADVANCED .7 DNX12 (GAUZE/BANDAGES/DRESSINGS) ×1 IMPLANT
ELECT REM PT RETURN 9FT ADLT (ELECTROSURGICAL) ×2
ELECTRODE REM PT RTRN 9FT ADLT (ELECTROSURGICAL) ×1 IMPLANT
GAUZE SPONGE 4X4 12PLY STRL (GAUZE/BANDAGES/DRESSINGS) ×2 IMPLANT
GLOVE BIO SURGEON STRL SZ7 (GLOVE) ×2 IMPLANT
GLOVE INDICATOR 7.5 STRL GRN (GLOVE) ×2 IMPLANT
GLOVE SURG SYN 8.0 (GLOVE) ×2 IMPLANT
GOWN STRL REUS W/ TWL LRG LVL3 (GOWN DISPOSABLE) ×2 IMPLANT
GOWN STRL REUS W/ TWL XL LVL3 (GOWN DISPOSABLE) ×1 IMPLANT
GOWN STRL REUS W/TWL LRG LVL3 (GOWN DISPOSABLE) ×2
GOWN STRL REUS W/TWL XL LVL3 (GOWN DISPOSABLE) ×1
KIT TURNOVER KIT A (KITS) ×2 IMPLANT
LABEL OR SOLS (LABEL) ×2 IMPLANT
MINICAP W/POVIDONE IODINE SOL (MISCELLANEOUS) ×2 IMPLANT
NEEDLE HYPO 25X1 1.5 SAFETY (NEEDLE) ×2 IMPLANT
NEEDLE INSUFFLATION 150MM (ENDOMECHANICALS) ×2 IMPLANT
NS IRRIG 500ML POUR BTL (IV SOLUTION) ×2 IMPLANT
PACK LAP CHOLECYSTECTOMY (MISCELLANEOUS) ×2 IMPLANT
PENCIL ELECTRO HAND CTR (MISCELLANEOUS) ×2 IMPLANT
SET TRANSFER 6 W/TWIST CLAMP 5 (SET/KITS/TRAYS/PACK) IMPLANT
SLEEVE ENDOPATH XCEL 5M (ENDOMECHANICALS) ×2 IMPLANT
SUT MNCRL AB 4-0 PS2 18 (SUTURE) ×2 IMPLANT
SUT VIC AB 3-0 SH 27 (SUTURE) ×2
SUT VIC AB 3-0 SH 27X BRD (SUTURE) ×2 IMPLANT
SUT VICRYL 0 AB UR-6 (SUTURE) ×4 IMPLANT
SYR 50ML LL SCALE MARK (SYRINGE) ×2 IMPLANT
TROCAR XCEL NON-BLD 5MMX100MML (ENDOMECHANICALS) ×2 IMPLANT
TUBING INSUFFLATION (TUBING) ×2 IMPLANT

## 2018-04-06 NOTE — Anesthesia Post-op Follow-up Note (Signed)
Anesthesia QCDR form completed.        

## 2018-04-06 NOTE — Anesthesia Postprocedure Evaluation (Signed)
Anesthesia Post Note  Patient: Wesley Henderson  Procedure(s) Performed: LAPAROSCOPIC REVISION CONTINUOUS AMBULATORY PERITONEAL DIALYSIS  (CAPD) CATHETER (N/A )  Patient location during evaluation: PACU Anesthesia Type: General Level of consciousness: awake and alert Pain management: pain level controlled Vital Signs Assessment: post-procedure vital signs reviewed and stable Respiratory status: spontaneous breathing and respiratory function stable Cardiovascular status: stable Anesthetic complications: no     Last Vitals:  Vitals:   04/06/18 1145 04/06/18 1535  BP: (!) 206/79 (!) 147/45  Pulse: 67 63  Resp: 16 18  Temp: 36.5 C (!) 36.4 C  SpO2: 100% 100%    Last Pain:  Vitals:   04/06/18 1535  TempSrc:   PainSc: 0-No pain                 Mitsuko Luera K

## 2018-04-06 NOTE — Anesthesia Preprocedure Evaluation (Signed)
Anesthesia Evaluation  Patient identified by MRN, date of birth, ID band Patient awake    Reviewed: Allergy & Precautions, H&P , NPO status , Patient's Chart, lab work & pertinent test results  History of Anesthesia Complications Negative for: history of anesthetic complications  Airway Mallampati: III  TM Distance: <3 FB Neck ROM: limited    Dental  (+) Chipped, Poor Dentition, Missing   Pulmonary neg shortness of breath,           Cardiovascular Exercise Tolerance: Good hypertension, (-) angina+ CAD, + CABG and +CHF       Neuro/Psych PSYCHIATRIC DISORDERS Anxiety negative neurological ROS  negative psych ROS   GI/Hepatic negative GI ROS, Neg liver ROS, neg GERD  ,  Endo/Other  diabetes, Type 2  Renal/GU ESRFRenal disease     Musculoskeletal   Abdominal   Peds  Hematology negative hematology ROS (+)   Anesthesia Other Findings Past Medical History: No date: Anemia No date: Anxiety No date: CHF (congestive heart failure) (HCC) No date: Chronic kidney disease No date: Diabetes mellitus without complication (HCC)     Comment:  Pre-Diabetic No date: Hyperlipidemia No date: Hypertension  Past Surgical History: 03/04/2018: CAPD INSERTION; N/A     Comment:  Procedure: LAPAROSCOPIC INSERTION CONTINUOUS AMBULATORY               PERITONEAL DIALYSIS  (CAPD) CATHETER;  Surgeon: Katha Cabal, MD;  Location: [facility] ORS;  Service: Vascular;                Laterality: N/A; No date: CORONARY ARTERY BYPASS GRAFT     Comment:  Oct 1996 No date: heart bypass     Reproductive/Obstetrics negative OB ROS                             Anesthesia Physical Anesthesia Plan  ASA: III  Anesthesia Plan: General ETT   Post-op Pain Management:    Induction: Intravenous  PONV Risk Score and Plan: 3 and Ondansetron, Dexamethasone, Midazolam and Treatment may vary due to age or  medical condition  Airway Management Planned: Oral ETT and Video Laryngoscope Planned  Additional Equipment:   Intra-op Plan:   Post-operative Plan: Extubation in OR  Informed Consent: I have reviewed the patients History and Physical, chart, labs and discussed the procedure including the risks, benefits and alternatives for the proposed anesthesia with the patient or authorized representative who has indicated his/her understanding and acceptance.   Dental Advisory Given  Plan Discussed with: Anesthesiologist, CRNA and Surgeon  Anesthesia Plan Comments: (Patient consented for risks of anesthesia including but not limited to:  - adverse reactions to medications - damage to teeth, lips or other oral mucosa - sore throat or hoarseness - Damage to heart, brain, lungs or loss of life  Patient voiced understanding.)        Anesthesia Quick Evaluation

## 2018-04-06 NOTE — H&P (Signed)
[facility] History & Physical Update  The patient was interviewed and re-examined.  The patient's previous History and Physical has been reviewed and is unchanged.  There is no change in the plan of care. We plan to proceed with the scheduled procedure.  Hortencia Pilar, MD  04/06/2018, 11:53 AM

## 2018-04-06 NOTE — Transfer of Care (Signed)
Immediate Anesthesia Transfer of Care Note  Patient: Wesley Henderson  Procedure(s) Performed: LAPAROSCOPIC REVISION CONTINUOUS AMBULATORY PERITONEAL DIALYSIS  (CAPD) CATHETER (N/A )  Patient Location: PACU  Anesthesia Type:General  Level of Consciousness: sedated  Airway & Oxygen Therapy: Patient Spontanous Breathing and Patient connected to face mask oxygen  Post-op Assessment: Report given to RN and Post -op Vital signs reviewed and stable  Post vital signs: Reviewed and stable  Last Vitals:  Vitals Value Taken Time  BP    Temp    Pulse 63 04/06/2018  3:35 PM  Resp 18 04/06/2018  3:35 PM  SpO2 100 % 04/06/2018  3:35 PM  Vitals shown include unvalidated device data.  Last Pain:  Vitals:   04/06/18 1145  TempSrc: Temporal         Complications: No apparent anesthesia complications

## 2018-04-06 NOTE — Discharge Instructions (Signed)
AMBULATORY SURGERY  °DISCHARGE INSTRUCTIONS ° ° °1) The drugs that you were given will stay in your system until tomorrow so for the next 24 hours you should not: ° °A) Drive an automobile °B) Make any legal decisions °C) Drink any alcoholic beverage ° ° °2) You may resume regular meals tomorrow.  Today it is better to start with liquids and gradually work up to solid foods. ° °You may eat anything you prefer, but it is better to start with liquids, then soup and crackers, and gradually work up to solid foods. ° ° °3) Please notify your doctor immediately if you have any unusual bleeding, trouble breathing, redness and pain at the surgery site, drainage, fever, or pain not relieved by medication. ° ° ° °4) Additional Instructions: ° ° ° ° ° ° ° °Please contact your physician with any problems or Same Day Surgery at [phone], Monday through Friday 6 am to 4 pm, or [place] [facility] [place] Main number at [phone]. °

## 2018-04-13 NOTE — Op Note (Signed)
  OPERATIVE NOTE   PROCEDURE: 1. Revision with laparoscopic assistance of the peritoneal dialysis catheter.  PRE-OPERATIVE DIAGNOSIS: 1. Complication of dialysis device with poor function of PD catheter 2. End stage renal disease  POST-OPERATIVE DIAGNOSIS: Same  SURGEON: Hortencia Pilar  ASSISTANT(S): None  ANESTHESIA: general  ESTIMATED BLOOD LOSS: Minimal   FINDING(S): 1. None  SPECIMEN(S): None  INDICATIONS:  Wesley Henderson is a 84 y.o. male who presents with nonfunction of his PD catheter. The patient has decided to continue with peritoneal dialysis for his long-term dialysis. Risks and benefits of placement were discussed and he is agreeable to proceed.  Differences between peritoneal dialysis and hemodialysis were discussed.    DESCRIPTION:  After obtaining full informed written consent, the patient was brought back to the operating room and placed supine upon the operating table. The patient received IV antibiotics prior to induction. After obtaining adequate anesthesia, the abdomen was prepped and draped in the standard fashion.   A small vertical incision was made in the midline just above the umbilicus and the dissection was carried down through the soft tissue to expose the fascia. The fascia is then elevated with a bone hook and a Varies needle is introduced.  A saline test was performed indicating intraperitoneal location and insufflated of the abdomen with carbon dioxide is performed to 15 mmHg pressure. A 5 mm trocar is then placed again maintaining elevation of the fascia with a bone hook.  Inspection of the abdominal contents demonstrates the PD catheter has migrated and is surrounded with omentum.  Two 5 mm trochars were then inserted under direct visualization without difficulty. Graspers were then introduced and the omentum is dissected away from the catheter. The catheter is then pulled extracorporeally and all remnants of omental tissue and fibrin are  removed from the catheter and it is flushed extensively with heparinized saline. The PD catheter is then reintroduced and using the graspers it is placed in the left lower quadrant. Immediate return of several 100 cc of effluent is noted. Excellent function is verified. The CO2 was then expelled from the abdominal cavity while the camera is maintained observing that the PD catheter does not shift. All trochars and then removed.  Both 3-0 Vicryl and 4-0 Monocryl are used to close the supraumbilical incision. The 5 mm trochars incisions are closed with 4-0 Monocryl subcuticular.  Dermabond is applied as dressing. Dry dressing was placed around the catheter exit site. The patient was then awakened from anesthesia and taken to the recovery room in stable condition having tolerated the procedure well.   COMPLICATIONS: None  CONDITION: None  Hortencia Pilar 04/13/2018, 9:32 AM

## 2018-04-19 ENCOUNTER — Telehealth (INDEPENDENT_AMBULATORY_CARE_PROVIDER_SITE_OTHER): Payer: Self-pay

## 2018-04-19 NOTE — Telephone Encounter (Signed)
Wesley Henderson called requesting the notes from this patient's last revision which was about a week ago.  I called Wesley Henderson back, but she was gone for the day. I got the fax number from the receptionist and faxed over the requested paperwork.

## 2018-04-20 ENCOUNTER — Ambulatory Visit
Admission: RE | Admit: 2018-04-20 | Discharge: 2018-04-20 | Disposition: A | Discharge status: Home / Self Care | Payer: Medicare Other | Source: Ambulatory Visit | Attending: Nephrology | Admitting: Nephrology

## 2018-04-20 ENCOUNTER — Other Ambulatory Visit: Payer: Self-pay | Admitting: Nephrology

## 2018-04-20 DIAGNOSIS — Y841 Kidney dialysis as the cause of abnormal reaction of the patient, or of later complication, without mention of misadventure at the time of the procedure: Secondary | ICD-10-CM | POA: Insufficient documentation

## 2018-04-20 DIAGNOSIS — X58XXXA Exposure to other specified factors, initial encounter: Secondary | ICD-10-CM | POA: Diagnosis not present

## 2018-04-20 DIAGNOSIS — T85611A Breakdown (mechanical) of intraperitoneal dialysis catheter, initial encounter: Secondary | ICD-10-CM | POA: Diagnosis present

## 2018-04-21 ENCOUNTER — Encounter (INDEPENDENT_AMBULATORY_CARE_PROVIDER_SITE_OTHER): Payer: Self-pay | Admitting: Vascular Surgery

## 2018-04-21 ENCOUNTER — Ambulatory Visit (INDEPENDENT_AMBULATORY_CARE_PROVIDER_SITE_OTHER): Payer: Medicare Other | Admitting: Vascular Surgery

## 2018-04-21 VITALS — BP 163/81 | HR 59 | Resp 14 | Ht 68.0 in | Wt 186.0 lb

## 2018-04-21 DIAGNOSIS — E1121 Type 2 diabetes mellitus with diabetic nephropathy: Secondary | ICD-10-CM | POA: Diagnosis not present

## 2018-04-21 DIAGNOSIS — I25118 Atherosclerotic heart disease of native coronary artery with other forms of angina pectoris: Secondary | ICD-10-CM

## 2018-04-21 DIAGNOSIS — N185 Chronic kidney disease, stage 5: Secondary | ICD-10-CM

## 2018-04-21 DIAGNOSIS — I10 Essential (primary) hypertension: Secondary | ICD-10-CM | POA: Diagnosis not present

## 2018-04-21 DIAGNOSIS — E785 Hyperlipidemia, unspecified: Secondary | ICD-10-CM

## 2018-04-21 NOTE — Progress Notes (Signed)
MRN : [id number]  Wesley Henderson is a 84 y.o. (09/07/1930) male who presents with chief complaint of  Chief Complaint  Patient presents with  . Follow-up    post op  .  History of Present Illness: The patient returns to the office for followup of their dialysis access. The function of the access has been stable.   The patient denies redness or swelling at the access site. The patient denies fever or chills at home or while on dialysis.  The patient denies amaurosis fugax or recent TIA symptoms. There are no recent neurological changes noted. The patient denies claudication symptoms or rest pain symptoms. The patient denies history of DVT, PE or superficial thrombophlebitis. The patient denies recent episodes of angina or shortness of breath.       Current Meds  Medication Sig  . amLODipine (NORVASC) 5 MG tablet Take 5 mg by mouth daily.   Marland Kitchen aspirin EC 81 MG tablet Take 81 mg by mouth every other day.  . carvedilol (COREG) 12.5 MG tablet Take 12.5 mg by mouth 2 (two) times daily with a meal.   . furosemide (LASIX) 40 MG tablet Take 40 mg by mouth 2 (two) times daily.   . patiromer (VELTASSA) 8.4 g packet Take 8.4 g by mouth daily. At noon  . simvastatin (ZOCOR) 20 MG tablet Take 20 mg by mouth at bedtime.   Marland Kitchen telmisartan (MICARDIS) 80 MG tablet Take 80 mg by mouth 2 (two) times daily.     Past Medical History:  Diagnosis Date  . Anemia   . Anxiety   . CHF (congestive heart failure) ([employer])   . Chronic kidney disease   . Diabetes mellitus without complication ([place])    Pre-Diabetic  . Hyperlipidemia   . Hypertension     Past Surgical History:  Procedure Laterality Date  . CAPD INSERTION N/A 03/04/2018   Procedure: LAPAROSCOPIC INSERTION CONTINUOUS AMBULATORY PERITONEAL DIALYSIS  (CAPD) CATHETER;  Surgeon: Katha Cabal, MD;  Location: [facility] ORS;  Service: Vascular;  Laterality: N/A;  . CAPD REVISION N/A 04/06/2018   Procedure: LAPAROSCOPIC REVISION CONTINUOUS  AMBULATORY PERITONEAL DIALYSIS  (CAPD) CATHETER;  Surgeon: Katha Cabal, MD;  Location: [facility] ORS;  Service: Vascular;  Laterality: N/A;  . CORONARY ARTERY BYPASS GRAFT     Oct 1996  . heart bypass      Social History Social History   Tobacco Use  . Smoking status: Never Smoker  . Smokeless tobacco: Never Used  Substance Use Topics  . Alcohol use: No    Frequency: Never  . Drug use: No    Family History History reviewed. No pertinent family history.  Allergies  Allergen Reactions  . Hydralazine Swelling    Swelling hands feet and ankles     REVIEW OF SYSTEMS (Negative unless checked)  Constitutional: [] Weight loss  [] Fever  [] Chills Cardiac: [] Chest pain   [] Chest pressure   [] Palpitations   [] Shortness of breath when laying flat   [] Shortness of breath with exertion. Vascular:  [] Pain in legs with walking   [] Pain in legs at rest  [] History of DVT   [] Phlebitis   [] Swelling in legs   [] Varicose veins   [] Non-healing ulcers Pulmonary:   [] Uses home oxygen   [] Productive cough   [] Hemoptysis   [] Wheeze  [] COPD   [] Asthma Neurologic:  [] Dizziness   [] Seizures   [] History of stroke   [] History of TIA  [] Aphasia   [] Vissual changes   [] Weakness or numbness  in arm   [] Weakness or numbness in leg Musculoskeletal:   [] Joint swelling   [] Joint pain   [] Low back pain Hematologic:  [] Easy bruising  [] Easy bleeding   [] Hypercoagulable state   [] Anemic Gastrointestinal:  [] Diarrhea   [] Vomiting  [] Gastroesophageal reflux/heartburn   [] Difficulty swallowing. Genitourinary:  [x] Chronic kidney disease   [] Difficult urination  [] Frequent urination   [] Blood in urine Skin:  [] Rashes   [] Ulcers  Psychological:  [] History of anxiety   []  History of major depression.  Physical Examination  Vitals:   04/21/18 1316  BP: (!) 163/81  Pulse: (!) 59  Resp: 14  Weight: 186 lb (84.4 kg)  Height: 5\' 8"  (1.727 m)   Body mass index is 28.28 kg/m. Gen: WD/WN, NAD Head: [place]/AT, No  temporalis wasting.  Ear/Nose/Throat: Hearing grossly intact, nares w/o erythema or drainage Eyes: PER, EOMI, sclera nonicteric.  Neck: Supple, no large masses.   Pulmonary:  Good air movement, no audible wheezing bilaterally, no use of accessory muscles.  Cardiac: RRR, no JVD Vascular:  PD cath CD&I Vessel Right Left  Radial Palpable Palpable  Gastrointestinal: Non-distended. No guarding/no peritoneal signs.  Musculoskeletal: M/S 5/5 throughout.  No deformity or atrophy.  Neurologic: CN 2-12 intact. Symmetrical.  Speech is fluent. Motor exam as listed above. Psychiatric: Judgment intact, Mood & affect appropriate for pt's clinical situation. Dermatologic: No rashes or ulcers noted.  No changes consistent with cellulitis. Lymph : No lichenification or skin changes of chronic lymphedema.  CBC Lab Results  Component Value Date   WBC 7.1 04/01/2018   HGB 8.2 (L) 04/06/2018   HCT 24.0 (L) 04/06/2018   MCV 95.2 04/01/2018   PLT 175 04/01/2018    BMET    Component Value Date/Time   NA 140 04/06/2018 1143   K 3.5 04/06/2018 1143   CL 106 04/01/2018 1415   CO2 19 (L) 04/01/2018 1415   GLUCOSE 112 (H) 04/06/2018 1143   BUN 52 (H) 04/01/2018 1415   CREATININE 5.93 (H) 04/01/2018 1415   CALCIUM 8.5 (L) 04/01/2018 1415   GFRNONAA 8 (L) 04/01/2018 1415   GFRAA 9 (L) 04/01/2018 1415   Estimated Creatinine Clearance: 9.5 mL/min (A) (by C-G formula based on SCr of 5.93 mg/dL (H)).  COAG Lab Results  Component Value Date   INR 1.07 04/01/2018   INR 1.05 02/25/2018    Radiology Dg Abd 1 View  Result Date: 04/20/2018 CLINICAL DATA:  Peritoneal dialysis catheter dysfunction. EXAM: ABDOMEN - 1 VIEW COMPARISON:  Radiographs of March 15, 2018. FINDINGS: The bowel gas pattern is normal. Phleboliths are noted in the pelvis. Distal tip of peritoneal dialysis catheter is noted in the pelvis. It does not appear to be kinked. IMPRESSION: No evidence of bowel obstruction or ileus. Distal tip of  peritoneal dialysis catheter noted in the pelvis. Electronically Signed   By: Marijo Conception, M.D.   On: 04/20/2018 16:23    Assessment/Plan 1. Stage 5 chronic kidney disease not on chronic dialysis [facility]) Recommend:  The patient is doing well and currently has adequate dialysis access.  The patient's dialysis center is not reporting any access issues.  The patient will follow-up with me in the officePRN  2. Essential hypertension Continue antihypertensive medications as already ordered, these medications have been reviewed and there are no changes at this time.   3. Coronary artery disease of native artery of native heart with stable angina pectoris (HCC) Continue cardiac and antihypertensive medications as already ordered and reviewed, no changes at  this time.  Continue statin as ordered and reviewed, no changes at this time  Nitrates PRN for chest pain   4. Type 2 diabetes with nephropathy (HCC) Continue hypoglycemic medications as already ordered, these medications have been reviewed and there are no changes at this time.  Hgb A1C to be monitored as already arranged by primary service   5. Hyperlipidemia, unspecified hyperlipidemia type Continue statin as ordered and reviewed, no changes at this time     Hortencia Pilar, MD  04/21/2018 9:18 PM

## 2018-05-14 DIAGNOSIS — N186 End stage renal disease: Secondary | ICD-10-CM | POA: Insufficient documentation

## 2018-05-20 HISTORY — PX: PERITONEAL CATHETER REMOVAL: SUR1106

## 2018-06-09 DIAGNOSIS — N189 Chronic kidney disease, unspecified: Secondary | ICD-10-CM | POA: Insufficient documentation

## 2018-06-09 DIAGNOSIS — D631 Anemia in chronic kidney disease: Secondary | ICD-10-CM | POA: Insufficient documentation

## 2018-06-09 IMAGING — CR DG ABDOMEN 1V
1 series · 2 of 2 positions shown · non-contrast
Comparison: Radiographs March 15, 2018.

CLINICAL DATA: Peritoneal dialysis catheter dysfunction.

EXAM:
ABDOMEN - 1 VIEW

[Series 1: dg abd 1 view · 0.14mm/px · 2 of 2 slices shown]
[im 1/2]
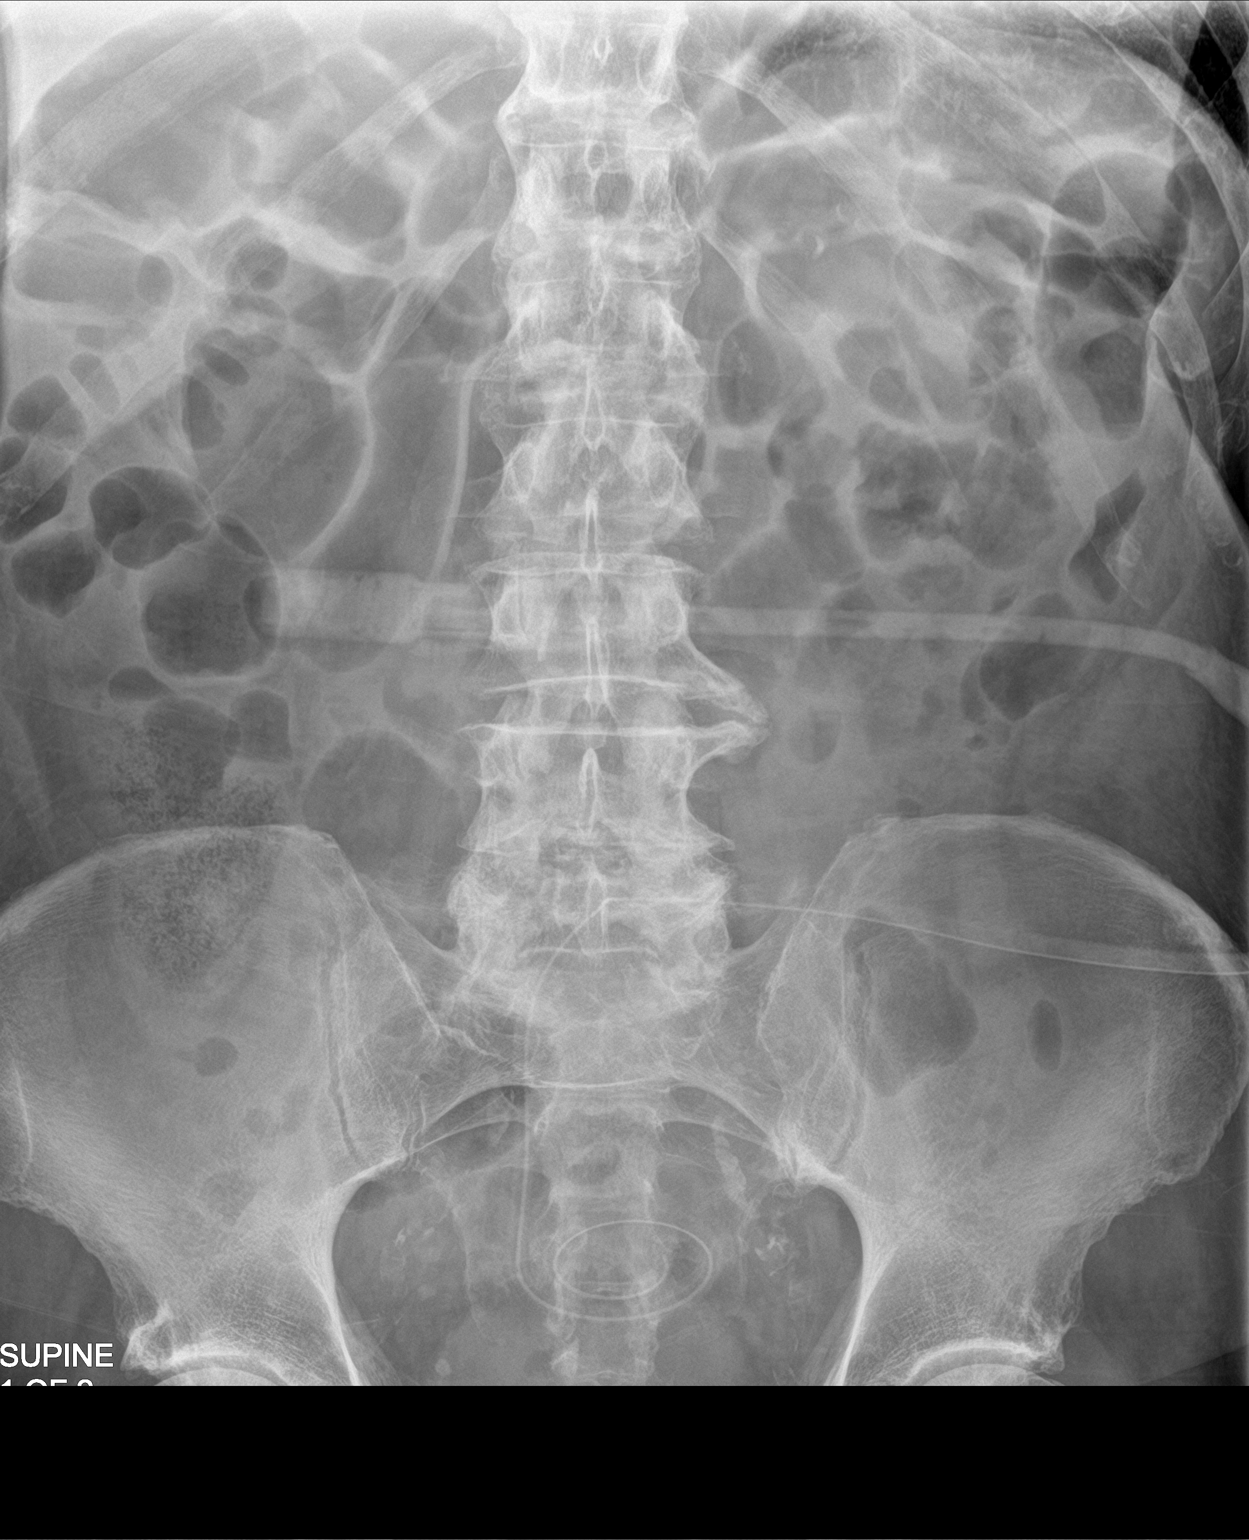
[im 2/2]
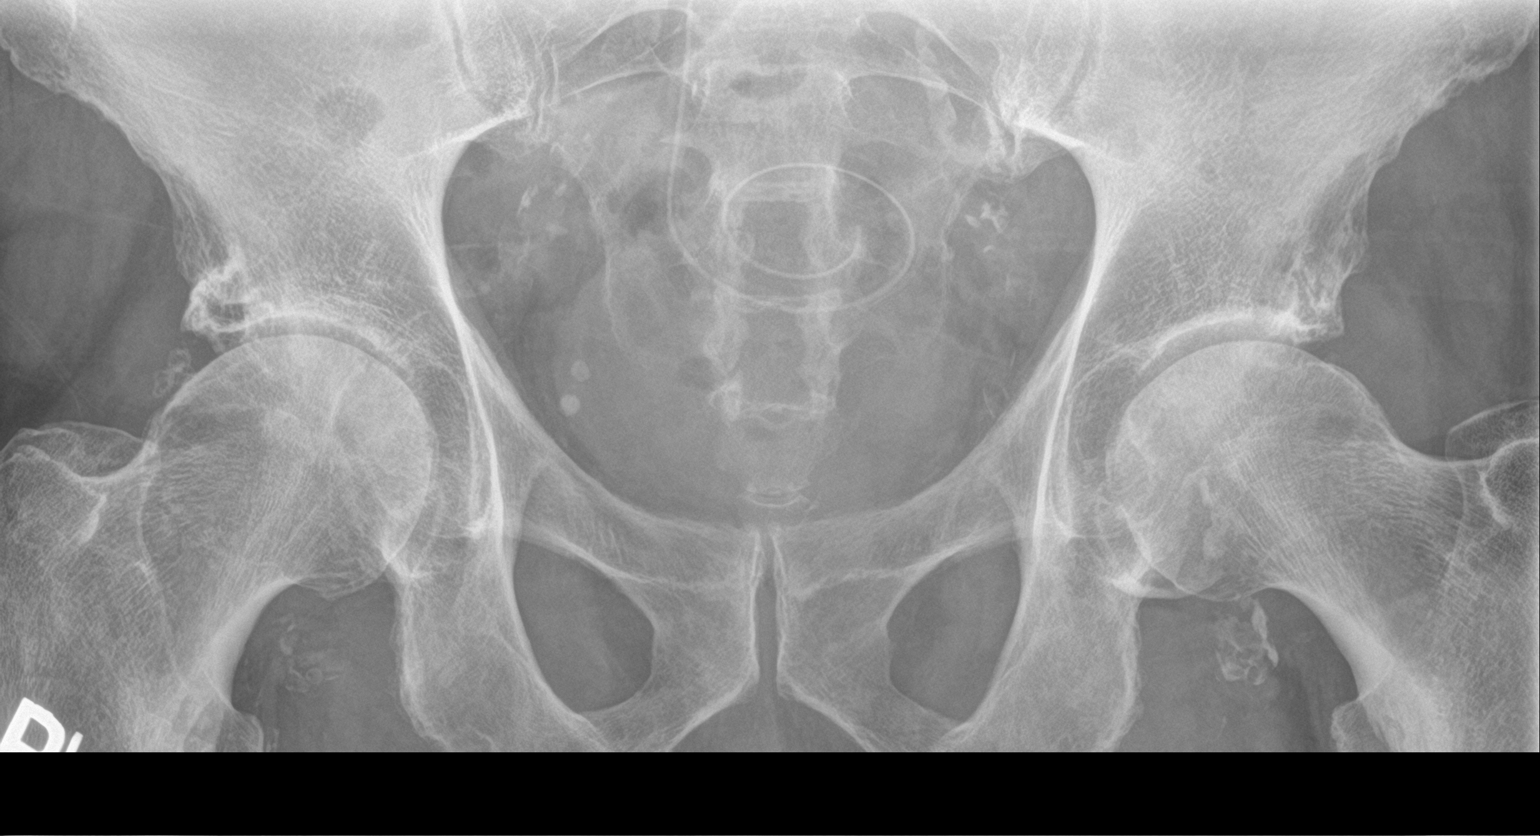

[2 of 2 positions shown; findings below may reference images not displayed]

FINDINGS: The bowel gas pattern is normal. Phleboliths are noted in the
pelvis. Distal tip of peritoneal dialysis catheter is noted in the
pelvis. It does not appear to be kinked.
IMPRESSION: No evidence of bowel obstruction or ileus. Distal tip of peritoneal
dialysis catheter noted in the pelvis.

## 2019-11-30 DIAGNOSIS — N2581 Secondary hyperparathyroidism of renal origin: Secondary | ICD-10-CM | POA: Insufficient documentation

## 2020-12-26 ENCOUNTER — Ambulatory Visit
Admission: RE | Admit: 2020-12-26 | Discharge: 2020-12-26 | Disposition: A | Discharge status: Home / Self Care | Payer: Medicare Other | Attending: Nephrology | Admitting: Nephrology

## 2020-12-26 ENCOUNTER — Other Ambulatory Visit: Payer: Self-pay

## 2020-12-26 ENCOUNTER — Other Ambulatory Visit: Payer: Self-pay | Admitting: Nephrology

## 2020-12-26 ENCOUNTER — Ambulatory Visit
Admission: RE | Admit: 2020-12-26 | Discharge: 2020-12-26 | Disposition: A | Discharge status: Home / Self Care | Payer: Medicare Other | Source: Ambulatory Visit | Attending: Nephrology | Admitting: Nephrology

## 2020-12-26 DIAGNOSIS — T85611A Breakdown (mechanical) of intraperitoneal dialysis catheter, initial encounter: Secondary | ICD-10-CM

## 2020-12-26 DIAGNOSIS — R52 Pain, unspecified: Secondary | ICD-10-CM

## 2021-02-05 ENCOUNTER — Other Ambulatory Visit: Payer: Self-pay

## 2021-02-05 ENCOUNTER — Emergency Department: Payer: Medicare Other

## 2021-02-05 ENCOUNTER — Inpatient Hospital Stay
Admission: EM | Admit: 2021-02-05 | Discharge: 2021-02-10 | DRG: 640 | Disposition: A | Discharge status: Home Health | Payer: Medicare Other | Attending: Internal Medicine | Admitting: Internal Medicine

## 2021-02-05 DIAGNOSIS — Z992 Dependence on renal dialysis: Secondary | ICD-10-CM

## 2021-02-05 DIAGNOSIS — Z20822 Contact with and (suspected) exposure to covid-19: Secondary | ICD-10-CM | POA: Diagnosis present

## 2021-02-05 DIAGNOSIS — J209 Acute bronchitis, unspecified: Secondary | ICD-10-CM

## 2021-02-05 DIAGNOSIS — Z79899 Other long term (current) drug therapy: Secondary | ICD-10-CM

## 2021-02-05 DIAGNOSIS — N2581 Secondary hyperparathyroidism of renal origin: Secondary | ICD-10-CM | POA: Diagnosis present

## 2021-02-05 DIAGNOSIS — Z951 Presence of aortocoronary bypass graft: Secondary | ICD-10-CM

## 2021-02-05 DIAGNOSIS — E875 Hyperkalemia: Principal | ICD-10-CM

## 2021-02-05 DIAGNOSIS — R059 Cough, unspecified: Secondary | ICD-10-CM

## 2021-02-05 DIAGNOSIS — Z6827 Body mass index (BMI) 27.0-27.9, adult: Secondary | ICD-10-CM

## 2021-02-05 DIAGNOSIS — D638 Anemia in other chronic diseases classified elsewhere: Secondary | ICD-10-CM | POA: Diagnosis present

## 2021-02-05 DIAGNOSIS — R296 Repeated falls: Secondary | ICD-10-CM | POA: Diagnosis present

## 2021-02-05 DIAGNOSIS — I132 Hypertensive heart and chronic kidney disease with heart failure and with stage 5 chronic kidney disease, or end stage renal disease: Secondary | ICD-10-CM | POA: Diagnosis present

## 2021-02-05 DIAGNOSIS — Z7982 Long term (current) use of aspirin: Secondary | ICD-10-CM

## 2021-02-05 DIAGNOSIS — Z8249 Family history of ischemic heart disease and other diseases of the circulatory system: Secondary | ICD-10-CM

## 2021-02-05 DIAGNOSIS — N189 Chronic kidney disease, unspecified: Secondary | ICD-10-CM | POA: Diagnosis present

## 2021-02-05 DIAGNOSIS — E46 Unspecified protein-calorie malnutrition: Secondary | ICD-10-CM | POA: Diagnosis present

## 2021-02-05 DIAGNOSIS — N186 End stage renal disease: Secondary | ICD-10-CM

## 2021-02-05 DIAGNOSIS — D631 Anemia in chronic kidney disease: Secondary | ICD-10-CM | POA: Diagnosis present

## 2021-02-05 DIAGNOSIS — E1122 Type 2 diabetes mellitus with diabetic chronic kidney disease: Secondary | ICD-10-CM | POA: Diagnosis present

## 2021-02-05 DIAGNOSIS — I4510 Unspecified right bundle-branch block: Secondary | ICD-10-CM | POA: Diagnosis present

## 2021-02-05 DIAGNOSIS — Z888 Allergy status to other drugs, medicaments and biological substances status: Secondary | ICD-10-CM

## 2021-02-05 DIAGNOSIS — I10 Essential (primary) hypertension: Secondary | ICD-10-CM | POA: Diagnosis present

## 2021-02-05 DIAGNOSIS — I5031 Acute diastolic (congestive) heart failure: Secondary | ICD-10-CM | POA: Diagnosis not present

## 2021-02-05 DIAGNOSIS — E876 Hypokalemia: Secondary | ICD-10-CM | POA: Diagnosis present

## 2021-02-05 DIAGNOSIS — I509 Heart failure, unspecified: Secondary | ICD-10-CM

## 2021-02-05 DIAGNOSIS — N2889 Other specified disorders of kidney and ureter: Secondary | ICD-10-CM

## 2021-02-05 DIAGNOSIS — R531 Weakness: Secondary | ICD-10-CM

## 2021-02-05 DIAGNOSIS — I2510 Atherosclerotic heart disease of native coronary artery without angina pectoris: Secondary | ICD-10-CM | POA: Diagnosis present

## 2021-02-05 DIAGNOSIS — R062 Wheezing: Secondary | ICD-10-CM

## 2021-02-05 DIAGNOSIS — R7303 Prediabetes: Secondary | ICD-10-CM | POA: Diagnosis present

## 2021-02-05 DIAGNOSIS — N281 Cyst of kidney, acquired: Secondary | ICD-10-CM | POA: Diagnosis present

## 2021-02-05 DIAGNOSIS — I959 Hypotension, unspecified: Secondary | ICD-10-CM | POA: Diagnosis not present

## 2021-02-05 DIAGNOSIS — E785 Hyperlipidemia, unspecified: Secondary | ICD-10-CM | POA: Diagnosis present

## 2021-02-05 DIAGNOSIS — T85611A Breakdown (mechanical) of intraperitoneal dialysis catheter, initial encounter: Secondary | ICD-10-CM

## 2021-02-05 LAB — BASIC METABOLIC PANEL
Anion gap: 9 (ref 5–15)
Anion gap: 8 (ref 5–15)
BUN: 33 mg/dL — ABNORMAL HIGH (ref 8–23)
BUN: 34 mg/dL — ABNORMAL HIGH (ref 8–23)
CO2: 26 mmol/L (ref 22–32)
CO2: 24 mmol/L (ref 22–32)
Calcium: 7.4 mg/dL — ABNORMAL LOW (ref 8.9–10.3)
Calcium: 7.3 mg/dL — ABNORMAL LOW (ref 8.9–10.3)
Chloride: 97 mmol/L — ABNORMAL LOW (ref 98–111)
Chloride: 99 mmol/L (ref 98–111)
Creatinine, Ser: 4.66 mg/dL — ABNORMAL HIGH (ref 0.61–1.24)
Creatinine, Ser: 4.72 mg/dL — ABNORMAL HIGH (ref 0.61–1.24)
GFR, Estimated: 11 mL/min — ABNORMAL LOW (ref >60)
GFR, Estimated: 11 mL/min — ABNORMAL LOW (ref >60)
Glucose, Bld: 153 mg/dL — ABNORMAL HIGH (ref 70–99)
Glucose, Bld: 61 mg/dL — ABNORMAL LOW (ref 70–99)
Potassium: 6.4 mmol/L (ref 3.5–5.1)
Potassium: 6.1 mmol/L — ABNORMAL HIGH (ref 3.5–5.1)
Sodium: 132 mmol/L — ABNORMAL LOW (ref 135–145)
Sodium: 131 mmol/L — ABNORMAL LOW (ref 135–145)

## 2021-02-05 LAB — CK: Total CK: 207 U/L (ref 49–397)

## 2021-02-05 LAB — CBC
HCT: 26.2 % — ABNORMAL LOW (ref 39.0–52.0)
Hemoglobin: 8.6 g/dL — ABNORMAL LOW (ref 13.0–17.0)
MCH: 33.5 pg (ref 26.0–34.0)
MCHC: 32.8 g/dL (ref 30.0–36.0)
MCV: 101.9 fL — ABNORMAL HIGH (ref 80.0–100.0)
Platelets: 322 10*3/uL (ref 150–400)
RBC: 2.57 MIL/uL — ABNORMAL LOW (ref 4.22–5.81)
RDW: 12.8 % (ref 11.5–15.5)
WBC: 14.1 10*3/uL — ABNORMAL HIGH (ref 4.0–10.5)
nRBC: 0 % (ref 0.0–0.2)

## 2021-02-05 LAB — HEPATIC FUNCTION PANEL
ALT: 18 U/L (ref 0–44)
AST: 27 U/L (ref 15–41)
Albumin: 1.6 g/dL — ABNORMAL LOW (ref 3.5–5.0)
Alkaline Phosphatase: 76 U/L (ref 38–126)
Bilirubin, Direct: 0.3 mg/dL — ABNORMAL HIGH (ref 0.0–0.2)
Indirect Bilirubin: 0.4 mg/dL (ref 0.3–0.9)
Total Bilirubin: 0.7 mg/dL (ref 0.3–1.2)
Total Protein: 5 g/dL — ABNORMAL LOW (ref 6.5–8.1)

## 2021-02-05 LAB — LACTIC ACID, PLASMA
Lactic Acid, Venous: 1.6 mmol/L (ref 0.5–1.9)
Lactic Acid, Venous: 1.4 mmol/L (ref 0.5–1.9)

## 2021-02-05 LAB — TROPONIN I (HIGH SENSITIVITY)
Troponin I (High Sensitivity): 27 ng/L — ABNORMAL HIGH (ref <18)
Troponin I (High Sensitivity): 31 ng/L — ABNORMAL HIGH (ref <18)

## 2021-02-05 LAB — RESP PANEL BY RT-PCR (FLU A&B, COVID) ARPGX2
Influenza A by PCR: NEGATIVE
Influenza B by PCR: NEGATIVE
SARS Coronavirus 2 by RT PCR: NEGATIVE

## 2021-02-05 LAB — BRAIN NATRIURETIC PEPTIDE: B Natriuretic Peptide: 368.6 pg/mL — ABNORMAL HIGH (ref 0.0–100.0)

## 2021-02-05 LAB — HEMOGLOBIN A1C
Hgb A1c MFr Bld: 6 % — ABNORMAL HIGH (ref 4.8–5.6)
Mean Plasma Glucose: 125.5 mg/dL

## 2021-02-05 LAB — GLUCOSE, CAPILLARY
Glucose-Capillary: 139 mg/dL — ABNORMAL HIGH (ref 70–99)
Glucose-Capillary: 191 mg/dL — ABNORMAL HIGH (ref 70–99)

## 2021-02-05 MED ORDER — SODIUM ZIRCONIUM CYCLOSILICATE 10 G PO PACK
10.0000 g | PACK | Freq: Every day | ORAL | Status: DC
  Administered 2021-02-05 – 2021-02-08 (×4): 10 g via ORAL
  Filled 2021-02-05 (×4): qty 1

## 2021-02-05 MED ORDER — IPRATROPIUM-ALBUTEROL 0.5-2.5 (3) MG/3ML IN SOLN
3.0000 mL | Freq: Once | RESPIRATORY_TRACT | Status: AC
  Administered 2021-02-05: 3 mL via RESPIRATORY_TRACT
  Filled 2021-02-05: qty 3

## 2021-02-05 MED ORDER — SODIUM CHLORIDE 0.9 % IV BOLUS
500.0000 mL | Freq: Once | INTRAVENOUS | Status: AC
  Administered 2021-02-05: 500 mL via INTRAVENOUS

## 2021-02-05 MED ORDER — AMLODIPINE BESYLATE 5 MG PO TABS
5.0000 mg | ORAL_TABLET | Freq: Every day | ORAL | Status: DC
  Administered 2021-02-06 – 2021-02-08 (×3): 5 mg via ORAL
  Filled 2021-02-05 (×3): qty 1

## 2021-02-05 MED ORDER — CARVEDILOL 12.5 MG PO TABS
12.5000 mg | ORAL_TABLET | Freq: Two times a day (BID) | ORAL | Status: DC
Start: 2021-02-05 — End: 2021-02-10
  Administered 2021-02-05 – 2021-02-10 (×10): 12.5 mg via ORAL
  Filled 2021-02-05 (×10): qty 1

## 2021-02-05 MED ORDER — INSULIN ASPART 100 UNIT/ML ~~LOC~~ SOLN: 0.0000 [IU] | Freq: Three times a day (TID) | SUBCUTANEOUS | Status: DC

## 2021-02-05 MED ORDER — ONDANSETRON HCL 4 MG/2ML IJ SOLN: 4.0000 mg | Freq: Four times a day (QID) | INTRAMUSCULAR | Status: DC | PRN

## 2021-02-05 MED ORDER — PATIROMER SORBITEX CALCIUM 8.4 G PO PACK: 25.2000 g | PACK | Freq: Every day | ORAL | Status: DC

## 2021-02-05 MED ORDER — HEPARIN SODIUM (PORCINE) 5000 UNIT/ML IJ SOLN
5000.0000 [IU] | Freq: Three times a day (TID) | INTRAMUSCULAR | Status: DC
  Administered 2021-02-05 – 2021-02-10 (×12): 5000 [IU] via SUBCUTANEOUS
  Filled 2021-02-05 (×14): qty 1

## 2021-02-05 MED ORDER — FUROSEMIDE 40 MG PO TABS
40.0000 mg | ORAL_TABLET | Freq: Two times a day (BID) | ORAL | Status: DC
  Administered 2021-02-05 – 2021-02-06 (×2): 40 mg via ORAL
  Filled 2021-02-05 (×2): qty 1

## 2021-02-05 MED ORDER — COLON CLEANSE PO CAPS: 4.0000 | ORAL_CAPSULE | Freq: Two times a day (BID) | ORAL | Status: DC

## 2021-02-05 MED ORDER — DEXTROSE 50 % IV SOLN
1.0000 | Freq: Once | INTRAVENOUS | Status: AC
  Administered 2021-02-05: 50 mL via INTRAVENOUS
  Filled 2021-02-05 (×2): qty 50

## 2021-02-05 MED ORDER — ONDANSETRON HCL 4 MG PO TABS: 4.0000 mg | ORAL_TABLET | Freq: Four times a day (QID) | ORAL | Status: DC | PRN

## 2021-02-05 MED ORDER — SIMVASTATIN 20 MG PO TABS
20.0000 mg | ORAL_TABLET | Freq: Every day | ORAL | Status: DC
  Administered 2021-02-05 – 2021-02-09 (×5): 20 mg via ORAL
  Filled 2021-02-05 (×5): qty 1

## 2021-02-05 MED ORDER — DEXTROSE 50 % IV SOLN
1.0000 | Freq: Once | INTRAVENOUS | Status: AC
  Administered 2021-02-05: 50 mL via INTRAVENOUS

## 2021-02-05 MED ORDER — PATIROMER SORBITEX CALCIUM 8.4 G PO PACK
8.4000 g | PACK | Freq: Every day | ORAL | Status: DC
  Filled 2021-02-05: qty 1

## 2021-02-05 MED ORDER — INSULIN ASPART 100 UNIT/ML ~~LOC~~ SOLN
7.0000 [IU] | Freq: Once | SUBCUTANEOUS | Status: AC
  Administered 2021-02-05: 7 [IU] via INTRAVENOUS
  Filled 2021-02-05: qty 1

## 2021-02-05 MED ORDER — INSULIN ASPART 100 UNIT/ML IV SOLN
10.0000 [IU] | Freq: Once | INTRAVENOUS | Status: AC
  Administered 2021-02-05: 10 [IU] via INTRAVENOUS
  Filled 2021-02-05: qty 0.1

## 2021-02-05 MED ORDER — CALCIUM GLUCONATE 10 % IV SOLN
1.0000 g | Freq: Once | INTRAVENOUS | Status: AC
  Administered 2021-02-05: 1 g via INTRAVENOUS
  Filled 2021-02-05: qty 10

## 2021-02-05 MED ORDER — GENTAMICIN SULFATE 0.1 % EX CREA
1.0000 "application " | TOPICAL_CREAM | Freq: Every day | CUTANEOUS | Status: DC
  Administered 2021-02-06 – 2021-02-08 (×2): 1 via TOPICAL
  Filled 2021-02-05: qty 15

## 2021-02-05 MED ORDER — ASPIRIN EC 81 MG PO TBEC
81.0000 mg | DELAYED_RELEASE_TABLET | ORAL | Status: DC
  Administered 2021-02-05 – 2021-02-10 (×4): 81 mg via ORAL
  Filled 2021-02-05 (×4): qty 1

## 2021-02-05 MED ORDER — DELFLEX-LC/2.5% DEXTROSE 394 MOSM/L IP SOLN
INTRAPERITONEAL | Status: DC
  Filled 2021-02-05 (×3): qty 3000

## 2021-02-05 NOTE — Progress Notes (Signed)
[facility]  ROUNDING NOTE   Subjective:   [name] was admitted to [facility] on 02/05/2021 for Hyperkalemia [E87.5] Generalized weakness [R53.1] Acute bronchitis, unspecified organism [J20.9]  Last peritoneal treatment was last night  Patient was found to have hypokalemia and then his primary nephrologist ordered potassium chloride. Since starting this medication, patient has been tired and weak. Yesterday, patient had follow up labs with a serum potassium of 6.2.   Brought to ED with a potassium of 6.4.   Objective:  Vital signs in last 24 hours:  Temp:  [97.5 F (36.4 C)-98.5 F (36.9 C)] 97.5 F (36.4 C) (02/16 1456) Pulse Rate:  [70-87] 76 (02/16 1456) Resp:  [16-29] 16 (02/16 1456) BP: (104-147)/(56-86) 104/62 (02/16 1456) SpO2:  [97 %-100 %] 100 % (02/16 1456) Weight:  [80.8 kg-81.6 kg] 80.8 kg (02/16 1456)  Weight change:  Filed Weights   02/05/21 0929 02/05/21 1456  Weight: 81.6 kg 80.8 kg    Intake/Output: No intake/output data recorded.   Intake/Output this shift:  Total I/O In: 500 [IV Piggyback:500] Out: -   Physical Exam: General: NAD,   Head: Normocephalic, atraumatic. Moist oral mucosal membranes  Eyes: Anicteric, PERRL  Neck: Supple, trachea midline  Lungs:  Clear to auscultation  Heart: Regular rate and rhythm  Abdomen:  Soft, nontender,   Extremities:  + peripheral edema.  Neurologic: Nonfocal, moving all four extremities  Skin: No lesions  Access: PD catheter    Basic Metabolic Panel: Recent Labs  Lab 02/05/21 0928 02/05/21 1509  NA 132* 131*  K 6.4* 6.1*  CL 97* 99  CO2 26 24  GLUCOSE 153* 61*  BUN 33* 34*  CREATININE 4.66* 4.72*  CALCIUM 7.4* 7.3*    Liver Function Tests: Recent Labs  Lab 02/05/21 1107  AST 27  ALT 18  ALKPHOS 76  BILITOT 0.7  PROT 5.0*  ALBUMIN 1.6*   No results for input(s): LIPASE, AMYLASE in the last 168 hours. No results for input(s): AMMONIA in the last 168  hours.  CBC: Recent Labs  Lab 02/05/21 0928  WBC 14.1*  HGB 8.6*  HCT 26.2*  MCV 101.9*  PLT 322    Cardiac Enzymes: Recent Labs  Lab 02/05/21 1509  CKTOTAL 207    BNP: Invalid input(s): POCBNP  CBG: No results for input(s): GLUCAP in the last 168 hours.  Microbiology: Results for orders placed or performed during the hospital encounter of 02/05/21  Resp Panel by RT-PCR (Flu A&B, Covid) Nasopharyngeal Swab     Status: None   Collection Time: 02/05/21 11:07 AM   Specimen: Nasopharyngeal Swab; Nasopharyngeal(NP) swabs in vial transport medium  Result Value Ref Range Status   SARS Coronavirus 2 by RT PCR NEGATIVE NEGATIVE Final    Comment: (NOTE) SARS-CoV-2 target nucleic acids are NOT DETECTED.  The SARS-CoV-2 RNA is generally detectable in upper respiratory specimens during the acute phase of infection. The lowest concentration of SARS-CoV-2 viral copies this assay can detect is 138 copies/mL. A negative result does not preclude SARS-Cov-2 infection and should not be used as the sole basis for treatment or other patient management decisions. A negative result may occur with  improper specimen collection/handling, submission of specimen other than nasopharyngeal swab, presence of viral mutation(s) within the areas targeted by this assay, and inadequate number of viral copies(<138 copies/mL). A negative result must be combined with clinical observations, patient history, and epidemiological information. The expected result is Negative.  Fact Sheet for Patients:  EntrepreneurPulse.com.au  Fact Sheet for Healthcare Providers:  IncredibleEmployment.be  This test is no t yet approved or cleared by the [place] FDA and  has been authorized for detection and/or diagnosis of SARS-CoV-2 by FDA under an Emergency Use Authorization (EUA). This EUA will remain  in effect (meaning this test can be used) for the duration of  the COVID-19 declaration under Section 564(b)(1) of the Act, 21 U.S.C.section 360bbb-3(b)(1), unless the authorization is terminated  or revoked sooner.       Influenza A by PCR NEGATIVE NEGATIVE Final   Influenza B by PCR NEGATIVE NEGATIVE Final    Comment: (NOTE) The Xpert Xpress SARS-CoV-2/FLU/RSV plus assay is intended as an aid in the diagnosis of influenza from Nasopharyngeal swab specimens and should not be used as a sole basis for treatment. Nasal washings and aspirates are unacceptable for Xpert Xpress SARS-CoV-2/FLU/RSV testing.  Fact Sheet for Patients: EntrepreneurPulse.com.au  Fact Sheet for Healthcare Providers: IncredibleEmployment.be  This test is not yet approved or cleared by the [place] FDA and has been authorized for detection and/or diagnosis of SARS-CoV-2 by FDA under an Emergency Use Authorization (EUA). This EUA will remain in effect (meaning this test can be used) for the duration of the COVID-19 declaration under Section 564(b)(1) of the Act, 21 U.S.C. section 360bbb-3(b)(1), unless the authorization is terminated or revoked.  Performed at [facility], [address] [place], [place] [address]     Coagulation Studies: No results for input(s): LABPROT, INR in the last 72 hours.  Urinalysis: No results for input(s): COLORURINE, LABSPEC, PHURINE, GLUCOSEU, HGBUR, BILIRUBINUR, KETONESUR, PROTEINUR, UROBILINOGEN, NITRITE, LEUKOCYTESUR in the last 72 hours.  Invalid input(s): APPERANCEUR    Imaging: DG Chest Portable 1 View  Result Date: 02/05/2021 CLINICAL DATA:  Shortness of breath, wheezing EXAM: PORTABLE CHEST 1 VIEW COMPARISON:  None. FINDINGS: Prior CABG. Heart is normal size. Aortic atherosclerosis. No confluent opacities or effusions. No acute bony abnormality. IMPRESSION: No active disease. Electronically Signed   By: Rolm Baptise M.D.   On: 02/05/2021 11:05     Medications:    .  amLODipine  5 mg Oral Daily  . aspirin EC  81 mg Oral QODAY  . carvedilol  12.5 mg Oral BID WC  . dextrose  1 ampule Intravenous Once  . furosemide  40 mg Oral BID  . heparin  5,000 Units Subcutaneous Q8H  . insulin aspart  10 Units Intravenous Once  . simvastatin  20 mg Oral QHS  . sodium zirconium cyclosilicate  10 g Oral Daily   ondansetron **OR** ondansetron (ZOFRAN) IV  Assessment/ Plan:  [name] is a 87 y.o. white male with end stage renal disease on peritoneal dialysis, hypertension, diabetes mellitus type II, hyperlipidemia, congestive heart failure who is admitted to [facility] on 02/05/2021 for Hyperkalemia [E87.5] Generalized weakness [R53.1] Acute bronchitis, unspecified organism [J20.9]  [place] 80kg Peritoneal Dialysis CCPD 8 hours 4 exchanges 2728m fills   1. End stage renal disease: with hyperkalemia - Peritoneal dialysis tonight. Orders prepared. 2.5% dextrose.  - [place] - Low potassium diet - Furosemide - hold telmisartan  2. Hypertension: 104/62 - hypotensive. Home regimen of amlodipine, carvedilol, furosemide, and telmisartan  3. Anemia of chronic kidney disease: hemoglobin 8.6 and macrocytic - EPO and venofer as outpatient  4. Secondary Hyperparathyroidism: with hypocalcemia. Corrected calcium of 9.2. Not currently on binders.   5. Nutrition: albumin of 1.6 - cause for patient's hypotension and peripheral edema.    LOS: 0 Haidar Muse  Jayr Lupercio 2/16/20224:19 PM

## 2021-02-05 NOTE — Plan of Care (Signed)

## 2021-02-05 NOTE — H&P (Signed)
History and Physical    Wesley Henderson [id number] DOB: [birth year] DOA: 02/05/2021  PCP: [name], MD   Patient coming from: Home  I have personally briefly reviewed patient's old medical records in [place]  Chief Complaint: Weakness  HPI: Wesley Henderson is a 87 y.o. male with medical history significant for end-stage renal disease on peritoneal dialysis, diabetes mellitus, CHF and coronary artery disease who presents to the ER for evaluation of several days of generalized weakness which has progressively worsened over the last couple of days and associated with multiple falls.  Patient denied feeling dizzy or lightheaded prior to his falls.  He denies having any loss of consciousness with the falls.  Patient also complains of increased abdominal girth, bilateral lower extremity swelling and shortness of breath with exertion associated with wheezing for the last 1 to 2 days.  He denies having any cough, no fever, no chills.  He had blood work done about 2 weeks ago and was told his potassium was low and was started on potassium replacement which he was taking for about 10 days.  His wife states that since he started taking the potassium that he has had a progressive decline. He denies having any abdominal pain, no nausea, no vomiting, no palpitations, no diaphoresis, no headache, no urinary frequency, no nocturia, no dysuria, no focal deficits, no headache, no blurred vision. Patient states that he has been compliant with his dialysis treatment but missed one treatment the day prior to his admission. Labs show sodium of 132, potassium 6.4, chloride 97, bicarb 26, glucose 153, BUN 33, creatinine 4.6, calcium 7.4, BNP 368, total CK 207, troponin 27 >> 31, white count 14.1, hemoglobin 8.6, hematocrit 26.2, MCV 101.9, RDW 12.8, platelet count 322 Respiratory viral panel is negative Chest x-ray reviewed by me shows no active cardiopulmonary disease. Twelve-lead EKG reviewed by  me shows normal sinus rhythm with a right bundle branch block.    ED Course: Patient is an 87 year old male who presents to the ER for evaluation of weakness and frequent falls.  Labs showed hyperkalemia, patient had a potassium level of 6.1 without any EKG changes.  He had been on potassium supplements at home.  He will be admitted to the hospital for further evaluation.    Review of Systems: As per HPI otherwise all other systems reviewed and negative.    Past Medical History:  Diagnosis Date  . Anemia   . Anxiety   . CHF (congestive heart failure) ([place])   . Chronic kidney disease   . Diabetes mellitus without complication ([place])    Pre-Diabetic  . Hyperlipidemia   . Hypertension     Past Surgical History:  Procedure Laterality Date  . CAPD INSERTION N/A 03/04/2018   Procedure: LAPAROSCOPIC INSERTION CONTINUOUS AMBULATORY PERITONEAL DIALYSIS  (CAPD) CATHETER;  Surgeon: Katha Cabal, MD;  Location: [facility] ORS;  Service: Vascular;  Laterality: N/A;  . CAPD REVISION N/A 04/06/2018   Procedure: LAPAROSCOPIC REVISION CONTINUOUS AMBULATORY PERITONEAL DIALYSIS  (CAPD) CATHETER;  Surgeon: Katha Cabal, MD;  Location: [facility] ORS;  Service: Vascular;  Laterality: N/A;  . CORONARY ARTERY BYPASS GRAFT     Oct 1996  . heart bypass       reports that he has never smoked. He has never used smokeless tobacco. He reports that he does not drink alcohol and does not use drugs.  Allergies  Allergen Reactions  . Hydralazine Swelling    Swelling hands feet and ankles  Family History  Problem Relation Age of Onset  . Other Mother   . Hypertension Mother       Prior to Admission medications   Medication Sig Start Date End Date Taking? Authorizing Provider  amLODipine (NORVASC) 10 MG tablet Take 10 mg by mouth daily.   Yes [provider]  aspirin EC 81 MG tablet Take 81 mg by mouth daily.   Yes [provider]  bisacodyl (DULCOLAX) 5 MG EC tablet Take 5 mg by  mouth daily as needed for moderate constipation.   Yes [provider]  carvedilol (COREG) 12.5 MG tablet Take 12.5 mg by mouth 2 (two) times daily with a meal.  04/27/17  Yes [provider]  furosemide (LASIX) 80 MG tablet Take 40 mg by mouth 2 (two) times daily.    Yes [provider]  potassium chloride SA (KLOR-CON) 20 MEQ tablet Take 40 mEq by mouth 2 (two) times daily. 01/22/21  Yes [provider]  psyllium (HYDROCIL/METAMUCIL) 95 % PACK Take 1 packet by mouth daily.   Yes [provider]  simvastatin (ZOCOR) 20 MG tablet Take 20 mg by mouth at bedtime.    Yes [provider]  telmisartan (MICARDIS) 80 MG tablet Take 80 mg by mouth daily.   Yes [provider]    Physical Exam: Vitals:   02/05/21 1121 02/05/21 1157 02/05/21 1311 02/05/21 1456  BP: (!) 145/86 (!) 141/74 (!) 106/56 104/62  Pulse: 84 87 70 76  Resp: (!) 28 (!) '29 17 16  '$ Temp: 98.5 F (36.9 C)   (!) 97.5 F (36.4 C)  TempSrc: Oral   Oral  SpO2: 100% 97% 98% 100%  Weight:    80.8 kg  Height:         Vitals:   02/05/21 1121 02/05/21 1157 02/05/21 1311 02/05/21 1456  BP: (!) 145/86 (!) 141/74 (!) 106/56 104/62  Pulse: 84 87 70 76  Resp: (!) 28 (!) '29 17 16  '$ Temp: 98.5 F (36.9 C)   (!) 97.5 F (36.4 C)  TempSrc: Oral   Oral  SpO2: 100% 97% 98% 100%  Weight:    80.8 kg  Height:          Constitutional: Alert and oriented x 3 . Not in any apparent distress HEENT:      Head: Normocephalic and atraumatic.         Eyes: PERLA, EOMI, Conjunctivae are normal. Sclera is non-icteric.       Mouth/Throat: Mucous membranes are moist.       Neck: Supple with no signs of meningismus. Cardiovascular: Regular rate and rhythm. No murmurs, gallops, or rubs. 2+ symmetrical distal pulses are present . No JVD. 2+LE edema Respiratory: Respiratory effort normal .Lungs sounds clear bilaterally.  Scattered wheezes, crackles, or rhonchi.  Gastrointestinal: Soft, non  tender, and non distended with positive bowel sounds.  PD catheter in place Genitourinary: No CVA tenderness. Musculoskeletal: Nontender with normal range of motion in all extremities. No cyanosis, or erythema of extremities. Neurologic:  Face is symmetric. Moving all extremities. No gross focal neurologic deficits.  Generalized weakness Skin: Skin is warm, dry.  No rash or ulcers Psychiatric: Mood and affect are normal   Labs on Admission: I have personally reviewed following labs and imaging studies  CBC: Recent Labs  Lab 02/05/21 0928  WBC 14.1*  HGB 8.6*  HCT 26.2*  MCV 101.9*  PLT [id number]   Basic Metabolic Panel: Recent Labs  Lab 02/05/21 0928 02/05/21  1509  NA 132* 131*  K 6.4* 6.1*  CL 97* 99  CO2 26 24  GLUCOSE 153* 61*  BUN 33* 34*  CREATININE 4.66* 4.72*  CALCIUM 7.4* 7.3*   GFR: Estimated Creatinine Clearance: 10.3 mL/min (A) (by C-G formula based on SCr of 4.72 mg/dL (H)). Liver Function Tests: Recent Labs  Lab 02/05/21 1107  AST 27  ALT 18  ALKPHOS 76  BILITOT 0.7  PROT 5.0*  ALBUMIN 1.6*   No results for input(s): LIPASE, AMYLASE in the last 168 hours. No results for input(s): AMMONIA in the last 168 hours. Coagulation Profile: No results for input(s): INR, PROTIME in the last 168 hours. Cardiac Enzymes: Recent Labs  Lab 02/05/21 1509  CKTOTAL 207   BNP (last 3 results) No results for input(s): PROBNP in the last 8760 hours. HbA1C: No results for input(s): HGBA1C in the last 72 hours. CBG: No results for input(s): GLUCAP in the last 168 hours. Lipid Profile: No results for input(s): CHOL, HDL, LDLCALC, TRIG, CHOLHDL, LDLDIRECT in the last 72 hours. Thyroid Function Tests: No results for input(s): TSH, T4TOTAL, FREET4, T3FREE, THYROIDAB in the last 72 hours. Anemia Panel: No results for input(s): VITAMINB12, FOLATE, FERRITIN, TIBC, IRON, RETICCTPCT in the last 72 hours. Urine analysis: No results found for: COLORURINE, APPEARANCEUR,  [place], [place], GLUCOSEU, HGBUR, BILIRUBINUR, KETONESUR, PROTEINUR, UROBILINOGEN, NITRITE, LEUKOCYTESUR  Radiological Exams on Admission: DG Chest Portable 1 View  Result Date: 02/05/2021 CLINICAL DATA:  Shortness of breath, wheezing EXAM: PORTABLE CHEST 1 VIEW COMPARISON:  None. FINDINGS: Prior CABG. Heart is normal size. Aortic atherosclerosis. No confluent opacities or effusions. No acute bony abnormality. IMPRESSION: No active disease. Electronically Signed   By: Rolm Baptise M.D.   On: 02/05/2021 11:05     Assessment/Plan Principal Problem:   Hyperkalemia Active Problems:   Chronic kidney disease   Essential hypertension   Diabetes mellitus with ESRD (end-stage renal disease) (HCC)   Anemia of chronic disease    Hyperkalemia Most likely iatrogenic and related to exogenous potassium supplementation We will treat patient with dextrose, insulin and Lokelma We will request nephrology consult for peritoneal dialysis Hold Micardis for now   Diabetes mellitus with complications of end-stage renal disease on peritoneal dialysis We will place patient on a consistent carbohydrate diet Sliding scale insulin for glycemic control Patient has end-stage renal disease as a complication of his diabetes mellitus and is on peritoneal dialysis. We will consult nephrology    Anemia of end-stage renal disease H&H is stable     Hypertension Continue amlodipine and carvedilol     Coronary artery disease Continue aspirin, statins and carvedilol    Frequent falls We will place patient on fall precautions Physical therapy evaluate and treat    DVT prophylaxis: Heparin Code Status: full code Family Communication: Greater than 50% of time was spent discussing patient's condition and plan of care with him and his wife at the bedside.  All questions and concerns have been addressed.  They verbalized understanding and agreed with the plan. Disposition Plan: Back to previous home  environment Consults called: Nephrology/Physical Therapy Status: Observation    Andreal Vultaggio MD [facility]     02/05/2021, 4:23 PM

## 2021-02-05 NOTE — ED Provider Notes (Signed)
[facility] Emergency Department Provider Note ____________________________________________   Event Date/Time   First MD Initiated Contact with Patient 02/05/21 1012     (approximate)  I have reviewed the triage vital signs and the nursing notes.   HISTORY  Chief Complaint Weakness    HPI Wesley Henderson is a 87 y.o. male with PMH as noted below including chronic kidney disease on peritoneal dialysis, DM, CHF, CAD presents with generalized weakness over the last week, gradual onset, associated with increased shortness of breath and wheezing over the last 1 to 2 days.  The patient states that he has fallen twice over the last few weeks due to being weak, however denies dizziness or loss of consciousness.  He has some mild left leg pain after falling yesterday.  He states he was found to have a low potassium last week and was started on a potassium pill, but states he had been feeling weaker ever since starting it.  Past Medical History:  Diagnosis Date  . Anemia   . Anxiety   . CHF (congestive heart failure) ([place])   . Chronic kidney disease   . Diabetes mellitus without complication ([place])    Pre-Diabetic  . Hyperlipidemia   . Hypertension     Patient Active Problem List   Diagnosis Date Noted  . Hyperkalemia 02/05/2021  . Chronic kidney disease 02/14/2018  . Essential hypertension 02/14/2018  . Hyperlipidemia 02/14/2018  . Coronary artery disease 02/14/2018  . Type 2 diabetes with nephropathy ([place]) 06/11/2016  . Anemia of chronic disease 06/11/2016  . Proteinuria 08/30/2014    Past Surgical History:  Procedure Laterality Date  . CAPD INSERTION N/A 03/04/2018   Procedure: LAPAROSCOPIC INSERTION CONTINUOUS AMBULATORY PERITONEAL DIALYSIS  (CAPD) CATHETER;  Surgeon: Katha Cabal, MD;  Location: [facility] ORS;  Service: Vascular;  Laterality: N/A;  . CAPD REVISION N/A 04/06/2018   Procedure: LAPAROSCOPIC REVISION CONTINUOUS AMBULATORY PERITONEAL  DIALYSIS  (CAPD) CATHETER;  Surgeon: Katha Cabal, MD;  Location: [facility] ORS;  Service: Vascular;  Laterality: N/A;  . CORONARY ARTERY BYPASS GRAFT     Oct 1996  . heart bypass      Prior to Admission medications   Medication Sig Start Date End Date Taking? Authorizing Provider  amLODipine (NORVASC) 5 MG tablet Take 5 mg by mouth daily.  10/21/17   [provider]  aspirin EC 81 MG tablet Take 81 mg by mouth every other day.    [provider]  carvedilol (COREG) 12.5 MG tablet Take 12.5 mg by mouth 2 (two) times daily with a meal.  04/27/17   [provider]  furosemide (LASIX) 40 MG tablet Take 40 mg by mouth 2 (two) times daily.     [provider]  simvastatin (ZOCOR) 20 MG tablet Take 20 mg by mouth at bedtime.  01/21/18   [provider]  telmisartan (MICARDIS) 80 MG tablet Take 80 mg by mouth 2 (two) times daily.     [provider]    Allergies Hydralazine  No family history on file.  Social History Social History   Tobacco Use  . Smoking status: Never Smoker  . Smokeless tobacco: Never Used  Vaping Use  . Vaping Use: Never used  Substance Use Topics  . Alcohol use: No  . Drug use: No    Review of Systems  Constitutional: No fever.  Positive for generalized weakness. Eyes: No redness. ENT: No sore throat. Cardiovascular: Denies chest pain. Respiratory: Positive for shortness  of breath. Gastrointestinal: No vomiting or diarrhea.  Genitourinary: Negative for dysuria.  Musculoskeletal: Negative for back pain. Skin: Negative for rash. Neurological: Negative for headache.   ____________________________________________   PHYSICAL EXAM:  VITAL SIGNS: ED Triage Vitals  Enc Vitals Group     BP 02/05/21 0928 (!) 110/57     Pulse Rate 02/05/21 0928 82     Resp 02/05/21 0928 18     Temp 02/05/21 0928 97.7 F (36.5 C)     Temp src --      SpO2 02/05/21 0928 98 %     Weight 02/05/21 0929 180 lb (81.6 kg)      Height 02/05/21 0929 '5\' 8"'$  (1.727 m)     Head Circumference --      Peak Flow --      Pain Score 02/05/21 0925 5     Pain Loc --      Pain Edu? --      Excl. in [place]? --     Constitutional: Alert and oriented.  Frail appearing but in no acute distress. Eyes: Conjunctivae are normal.  EOMI. Head: Atraumatic. Nose: No congestion/rhinnorhea. Mouth/Throat: Mucous membranes are dry. Neck: Normal range of motion.  Cardiovascular: Normal rate, regular rhythm. Grossly normal heart sounds.  Good peripheral circulation. Respiratory: Increased respiratory effort with some accessory muscle use..  Faint wheezing bilaterally. Gastrointestinal: Soft and nontender. No distention.  Genitourinary: No flank tenderness. Musculoskeletal: 1+ bilateral lower extremity edema.  Extremities warm and well perfused.  Full range of motion of bilateral hips and knees. Neurologic:  Normal speech and language.  Motor intact in all extremities.  Normal coordination. Skin:  Skin is warm and dry. No rash noted. Psychiatric: Mood and affect are normal. Speech and behavior are normal.  ____________________________________________   LABS (all labs ordered are listed, but only abnormal results are displayed)  Labs Reviewed  BASIC METABOLIC PANEL - Abnormal; Notable for the following components:      Result Value   Sodium 132 (*)    Potassium 6.4 (*)    Chloride 97 (*)    Glucose, Bld 153 (*)    BUN 33 (*)    Creatinine, Ser 4.66 (*)    Calcium 7.4 (*)    GFR, Estimated 11 (*)    All other components within normal limits  CBC - Abnormal; Notable for the following components:   WBC 14.1 (*)    RBC 2.57 (*)    Hemoglobin 8.6 (*)    HCT 26.2 (*)    MCV 101.9 (*)    All other components within normal limits  BRAIN NATRIURETIC PEPTIDE - Abnormal; Notable for the following components:   B Natriuretic Peptide 368.6 (*)    All other components within normal limits  TROPONIN I (HIGH SENSITIVITY) - Abnormal; Notable  for the following components:   Troponin I (High Sensitivity) 27 (*)    All other components within normal limits  RESP PANEL BY RT-PCR (FLU A&B, COVID) ARPGX2  LACTIC ACID, PLASMA  URINALYSIS, COMPLETE (UACMP) WITH MICROSCOPIC  LACTIC ACID, PLASMA  BASIC METABOLIC PANEL  CK  CBG MONITORING, ED  TROPONIN I (HIGH SENSITIVITY)   ____________________________________________  EKG  ED ECG REPORT I, Arta Silence, the attending physician, personally viewed and interpreted this ECG.  Date: 02/05/2021 EKG Time: 0919 Rate: 84 Rhythm: sinus rhythm QRS Axis: normal Intervals: RBBB, prolonged PR ST/T Wave abnormalities: normal; no peaked T waves Narrative Interpretation: RBBB with no evidence of acute ischemia  ____________________________________________  RADIOLOGY  Chest x-ray interpreted by me shows no focal infiltrate or edema  ____________________________________________   PROCEDURES  Procedure(s) performed: No  Procedures  Critical Care performed: Yes  CRITICAL CARE Performed by: Arta Silence   Total critical care time: 30 minutes  Critical care time was exclusive of separately billable procedures and treating other patients.  Critical care was necessary to treat or prevent imminent or life-threatening deterioration.  Critical care was time spent personally by me on the following activities: development of treatment plan with patient and/or surrogate as well as nursing, discussions with consultants, evaluation of patient's response to treatment, examination of patient, obtaining history from patient or surrogate, ordering and performing treatments and interventions, ordering and review of laboratory studies, ordering and review of radiographic studies, pulse oximetry and re-evaluation of patient's condition. ____________________________________________   INITIAL IMPRESSION / ASSESSMENT AND PLAN / ED COURSE  Pertinent labs & imaging results that were  available during my care of the patient were reviewed by me and considered in my medical decision making (see chart for details).  87 year old male with PMH as noted above including CKD on peritoneal dialysis DM, CHF, and CAD presents with increased generalized weakness over the last week associated with a recent diagnosis of hypokalemia treated with potassium supplementation.  He has also has had some shortness of breath over the last 1 to 2 days.  On exam, the patient is overall weak and frail appearing.  He is tachypneic with otherwise normal vital signs.  Lung exam reveals bilateral wheezing.  Neurologic exam is nonfocal.  Exam is otherwise as described above.  EKG shows prolonged PR and RBBB but no peaked T waves or prolonged QRS.  Initial labs obtained from triage reveal a potassium of 6.4 and leukocytosis.  Differential includes weakness due to hyperkalemia, dehydration, other metabolic disturbance, pneumonia, COVID-19, UTI or other infection/sepsis, or less likely primary cardiac cause.  I have added on additional labs and chest x-ray.  I have also ordered a fluid bolus, duo nebs, insulin and D50, and calcium to treat the hyperkalemia.  ----------------------------------------- 2:27 PM on 02/05/2021 -----------------------------------------  On reassessment, the patient appears more comfortable.  His tachypnea and work of breathing have improved.  His other vital signs have remained stable.  Additional lab work-up so far is unrevealing.  Chest x-ray shows no focal infiltrate or edema.  The patient makes very little urine throughout the day so we are still pending a sample although he has no symptoms to suggest UTI.  Given the hyperkalemia, we will proceed with admission.  I consulted Dr. Francine Graven from the hospitalist service.  Repeat BMP is pending after treatment for hyper K.  _________________________  Janann August was evaluated in Emergency Department on 02/05/2021 for the  symptoms described in the history of present illness. He was evaluated in the context of the global COVID-19 pandemic, which necessitated consideration that the patient might be at risk for infection with the SARS-CoV-2 virus that causes COVID-19. Institutional protocols and algorithms that pertain to the evaluation of patients at risk for COVID-19 are in a state of rapid change based on information released by regulatory bodies including the CDC and federal and state organizations. These policies and algorithms were followed during the patient's care in the ED.  ____________________________________________   FINAL CLINICAL IMPRESSION(S) / ED DIAGNOSES  Final diagnoses:  Hyperkalemia  Generalized weakness  Acute bronchitis, unspecified organism      NEW MEDICATIONS STARTED DURING THIS VISIT:  New Prescriptions   No medications on  file     Note:  This document was prepared using Dragon voice recognition software and may include unintentional dictation errors.    Arta Silence, MD 02/05/21 1428

## 2021-02-05 NOTE — ED Notes (Signed)
Pt taken to ED 18 at  This time. RN Katie at bedside.

## 2021-02-05 NOTE — ED Notes (Signed)
MD Corky Downs informed of pt's K+ of 6.4

## 2021-02-05 NOTE — Progress Notes (Signed)
PHARMACIST - PHYSICIAN ORDER COMMUNICATION  CONCERNING: P&T Medication Policy on Herbal Medications  DESCRIPTION:  This patient's order for:  Colon cleanse  has been noted.  This product(s) is classified as an "herbal" or natural product. Due to a lack of definitive safety studies or FDA approval, nonstandard manufacturing practices, plus the potential risk of unknown drug-drug interactions while on inpatient medications, the Pharmacy and Therapeutics Committee does not permit the use of "herbal" or natural products of this type within [facility].   ACTION TAKEN: The pharmacy department is unable to verify this order at this time and your patient has been informed of this safety policy. Please reevaluate patient's clinical condition at discharge and address if the herbal or natural product(s) should be resumed at that time.

## 2021-02-05 NOTE — ED Triage Notes (Addendum)
Pt comes via POV with wife with c/o increased weakness. Wife states pt's K+ was low. Pt had a fall yesterday. Pt has bandage to right forearm.  Pt states he tripped and fell yesterday. Pt denies any LOC. Pt not on thinners. Pt states pain to left leg. Pt states he has dialysis treatments at home.  Pt states decreased intake. Pt states he was sent here from dialysis in regards to his K+

## 2021-02-06 ENCOUNTER — Inpatient Hospital Stay: Payer: Medicare Other

## 2021-02-06 ENCOUNTER — Observation Stay: Payer: Medicare Other

## 2021-02-06 DIAGNOSIS — Z951 Presence of aortocoronary bypass graft: Secondary | ICD-10-CM | POA: Diagnosis not present

## 2021-02-06 DIAGNOSIS — D638 Anemia in other chronic diseases classified elsewhere: Secondary | ICD-10-CM | POA: Diagnosis not present

## 2021-02-06 DIAGNOSIS — N186 End stage renal disease: Secondary | ICD-10-CM

## 2021-02-06 DIAGNOSIS — E876 Hypokalemia: Secondary | ICD-10-CM | POA: Diagnosis present

## 2021-02-06 DIAGNOSIS — J209 Acute bronchitis, unspecified: Secondary | ICD-10-CM | POA: Diagnosis present

## 2021-02-06 DIAGNOSIS — I5031 Acute diastolic (congestive) heart failure: Secondary | ICD-10-CM | POA: Diagnosis not present

## 2021-02-06 DIAGNOSIS — R7303 Prediabetes: Secondary | ICD-10-CM | POA: Diagnosis present

## 2021-02-06 DIAGNOSIS — E46 Unspecified protein-calorie malnutrition: Secondary | ICD-10-CM | POA: Diagnosis present

## 2021-02-06 DIAGNOSIS — E875 Hyperkalemia: Secondary | ICD-10-CM | POA: Diagnosis present

## 2021-02-06 DIAGNOSIS — R296 Repeated falls: Secondary | ICD-10-CM | POA: Diagnosis present

## 2021-02-06 DIAGNOSIS — I4510 Unspecified right bundle-branch block: Secondary | ICD-10-CM | POA: Diagnosis present

## 2021-02-06 DIAGNOSIS — E1122 Type 2 diabetes mellitus with diabetic chronic kidney disease: Secondary | ICD-10-CM

## 2021-02-06 DIAGNOSIS — I10 Essential (primary) hypertension: Secondary | ICD-10-CM | POA: Diagnosis not present

## 2021-02-06 DIAGNOSIS — R531 Weakness: Secondary | ICD-10-CM | POA: Diagnosis not present

## 2021-02-06 DIAGNOSIS — Z6827 Body mass index (BMI) 27.0-27.9, adult: Secondary | ICD-10-CM | POA: Diagnosis not present

## 2021-02-06 DIAGNOSIS — I509 Heart failure, unspecified: Secondary | ICD-10-CM

## 2021-02-06 DIAGNOSIS — Z992 Dependence on renal dialysis: Secondary | ICD-10-CM | POA: Diagnosis not present

## 2021-02-06 DIAGNOSIS — I2510 Atherosclerotic heart disease of native coronary artery without angina pectoris: Secondary | ICD-10-CM | POA: Diagnosis present

## 2021-02-06 DIAGNOSIS — Z20822 Contact with and (suspected) exposure to covid-19: Secondary | ICD-10-CM | POA: Diagnosis present

## 2021-02-06 DIAGNOSIS — Z7982 Long term (current) use of aspirin: Secondary | ICD-10-CM | POA: Diagnosis not present

## 2021-02-06 DIAGNOSIS — R062 Wheezing: Secondary | ICD-10-CM

## 2021-02-06 DIAGNOSIS — I959 Hypotension, unspecified: Secondary | ICD-10-CM | POA: Diagnosis not present

## 2021-02-06 DIAGNOSIS — Z888 Allergy status to other drugs, medicaments and biological substances status: Secondary | ICD-10-CM | POA: Diagnosis not present

## 2021-02-06 DIAGNOSIS — N2581 Secondary hyperparathyroidism of renal origin: Secondary | ICD-10-CM | POA: Diagnosis present

## 2021-02-06 DIAGNOSIS — D631 Anemia in chronic kidney disease: Secondary | ICD-10-CM | POA: Diagnosis present

## 2021-02-06 DIAGNOSIS — E785 Hyperlipidemia, unspecified: Secondary | ICD-10-CM | POA: Diagnosis present

## 2021-02-06 DIAGNOSIS — N281 Cyst of kidney, acquired: Secondary | ICD-10-CM | POA: Diagnosis present

## 2021-02-06 DIAGNOSIS — N185 Chronic kidney disease, stage 5: Secondary | ICD-10-CM | POA: Diagnosis not present

## 2021-02-06 DIAGNOSIS — Z8249 Family history of ischemic heart disease and other diseases of the circulatory system: Secondary | ICD-10-CM | POA: Diagnosis not present

## 2021-02-06 DIAGNOSIS — I132 Hypertensive heart and chronic kidney disease with heart failure and with stage 5 chronic kidney disease, or end stage renal disease: Secondary | ICD-10-CM | POA: Diagnosis present

## 2021-02-06 LAB — BASIC METABOLIC PANEL
Anion gap: 8 (ref 5–15)
BUN: 35 mg/dL — ABNORMAL HIGH (ref 8–23)
CO2: 26 mmol/L (ref 22–32)
Calcium: 7.2 mg/dL — ABNORMAL LOW (ref 8.9–10.3)
Chloride: 97 mmol/L — ABNORMAL LOW (ref 98–111)
Creatinine, Ser: 4.47 mg/dL — ABNORMAL HIGH (ref 0.61–1.24)
GFR, Estimated: 12 mL/min — ABNORMAL LOW (ref >60)
Glucose, Bld: 172 mg/dL — ABNORMAL HIGH (ref 70–99)
Potassium: 5.2 mmol/L — ABNORMAL HIGH (ref 3.5–5.1)
Sodium: 131 mmol/L — ABNORMAL LOW (ref 135–145)

## 2021-02-06 LAB — URINALYSIS, COMPLETE (UACMP) WITH MICROSCOPIC
Bacteria, UA: NONE SEEN
Bilirubin Urine: NEGATIVE
Glucose, UA: NEGATIVE mg/dL
Ketones, ur: NEGATIVE mg/dL
Leukocytes,Ua: NEGATIVE
Nitrite: NEGATIVE
Protein, ur: NEGATIVE mg/dL
Specific Gravity, Urine: 1.008 (ref 1.005–1.030)
Squamous Epithelial / LPF: NONE SEEN (ref 0–5)
pH: 8 (ref 5.0–8.0)

## 2021-02-06 LAB — CBC
HCT: 25.4 % — ABNORMAL LOW (ref 39.0–52.0)
Hemoglobin: 8.6 g/dL — ABNORMAL LOW (ref 13.0–17.0)
MCH: 33.9 pg (ref 26.0–34.0)
MCHC: 33.9 g/dL (ref 30.0–36.0)
MCV: 100 fL (ref 80.0–100.0)
Platelets: 313 10*3/uL (ref 150–400)
RBC: 2.54 MIL/uL — ABNORMAL LOW (ref 4.22–5.81)
RDW: 12.6 % (ref 11.5–15.5)
WBC: 9.2 10*3/uL (ref 4.0–10.5)
nRBC: 0 % (ref 0.0–0.2)

## 2021-02-06 LAB — PROTEIN / CREATININE RATIO, URINE
Creatinine, Urine: 51 mg/dL
Protein Creatinine Ratio: 0.41 mg/mg{Cre} — ABNORMAL HIGH (ref 0.00–0.15)
Total Protein, Urine: 21 mg/dL

## 2021-02-06 LAB — GLUCOSE, CAPILLARY
Glucose-Capillary: 120 mg/dL — ABNORMAL HIGH (ref 70–99)
Glucose-Capillary: 140 mg/dL — ABNORMAL HIGH (ref 70–99)
Glucose-Capillary: 122 mg/dL — ABNORMAL HIGH (ref 70–99)
Glucose-Capillary: 165 mg/dL — ABNORMAL HIGH (ref 70–99)

## 2021-02-06 LAB — IRON AND TIBC: Iron: 24 ug/dL — ABNORMAL LOW (ref 45–182)

## 2021-02-06 LAB — FERRITIN: Ferritin: 1047 ng/mL — ABNORMAL HIGH (ref 24–336)

## 2021-02-06 MED ORDER — IPRATROPIUM-ALBUTEROL 0.5-2.5 (3) MG/3ML IN SOLN
3.0000 mL | Freq: Two times a day (BID) | RESPIRATORY_TRACT | Status: DC
  Administered 2021-02-06 – 2021-02-09 (×6): 3 mL via RESPIRATORY_TRACT
  Filled 2021-02-06 (×6): qty 3

## 2021-02-06 MED ORDER — INSULIN ASPART 100 UNIT/ML ~~LOC~~ SOLN: 0.0000 [IU] | Freq: Every day | SUBCUTANEOUS | Status: DC

## 2021-02-06 MED ORDER — FUROSEMIDE 10 MG/ML IJ SOLN
60.0000 mg | Freq: Two times a day (BID) | INTRAMUSCULAR | Status: DC
  Administered 2021-02-06 – 2021-02-09 (×6): 60 mg via INTRAVENOUS
  Filled 2021-02-06 (×6): qty 6

## 2021-02-06 MED ORDER — INSULIN ASPART 100 UNIT/ML ~~LOC~~ SOLN: 0.0000 [IU] | Freq: Three times a day (TID) | SUBCUTANEOUS | Status: DC

## 2021-02-06 MED ORDER — FUROSEMIDE 10 MG/ML IJ SOLN
40.0000 mg | Freq: Once | INTRAMUSCULAR | Status: AC
Start: 2021-02-06 — End: 2021-02-06
  Administered 2021-02-06: 40 mg via INTRAVENOUS
  Filled 2021-02-06: qty 4

## 2021-02-06 MED ORDER — IPRATROPIUM-ALBUTEROL 0.5-2.5 (3) MG/3ML IN SOLN
3.0000 mL | Freq: Four times a day (QID) | RESPIRATORY_TRACT | Status: DC
  Administered 2021-02-06: 3 mL via RESPIRATORY_TRACT
  Filled 2021-02-06: qty 6

## 2021-02-06 MED ORDER — ALBUMIN HUMAN 25 % IV SOLN
25.0000 g | Freq: Once | INTRAVENOUS | Status: AC
  Administered 2021-02-06: 25 g via INTRAVENOUS
  Filled 2021-02-06: qty 100

## 2021-02-06 MED ORDER — FUROSEMIDE 10 MG/ML IJ SOLN
20.0000 mg | Freq: Once | INTRAMUSCULAR | Status: AC
  Administered 2021-02-06: 20 mg via INTRAVENOUS
  Filled 2021-02-06: qty 2

## 2021-02-06 MED ORDER — FUROSEMIDE 10 MG/ML IJ SOLN: 60.0000 mg | Freq: Two times a day (BID) | INTRAMUSCULAR | Status: DC

## 2021-02-06 MED ORDER — SODIUM CHLORIDE 0.9% FLUSH
3.0000 mL | Freq: Two times a day (BID) | INTRAVENOUS | Status: DC
  Administered 2021-02-06 – 2021-02-07 (×2): 3 mL via INTRAVENOUS
  Administered 2021-02-07: 10 mL via INTRAVENOUS
  Administered 2021-02-08 – 2021-02-10 (×5): 3 mL via INTRAVENOUS

## 2021-02-06 MED ORDER — GUAIFENESIN-DM 100-10 MG/5ML PO SYRP
5.0000 mL | ORAL_SOLUTION | ORAL | Status: DC | PRN
  Administered 2021-02-06 – 2021-02-10 (×8): 5 mL via ORAL
  Filled 2021-02-06 (×8): qty 5

## 2021-02-06 NOTE — Progress Notes (Signed)
Inpatient Diabetes Program Recommendations  AACE/ADA: New Consensus Statement on Inpatient Glycemic Control (2015)  Target Ranges:  Prepandial:   less than 140 mg/dL      Peak postprandial:   less than 180 mg/dL (1-2 hours)      Critically ill patients:  140 - 180 mg/dL   Lab Results  Component Value Date   GLUCAP 120 (H) 02/06/2021   HGBA1C 6.0 (H) 02/05/2021    Review of Glycemic Control Results for Wesley Henderson, Wesley Henderson (MRN [id number]) as of 02/06/2021 11:54  Ref. Range 02/06/2021 09:09 02/06/2021 11:46  Glucose-Capillary Latest Ref Range: 70 - 99 mg/dL 120 (H) 140 (H)   Diabetes history: DM 2 Outpatient Diabetes medications:  None listed Current orders for Inpatient glycemic control:  Novolog moderate tid with meals Inpatient Diabetes Program Recommendations:   Note that patient has history of ESRD and on PD.  Consider reducing Novolog correction to very sensitive (0-6 units) tid with meals and HS.   Thanks,  Adah Perl, RN, BC-ADM Inpatient Diabetes Coordinator Pager [phone] (8a-5p)

## 2021-02-06 NOTE — Progress Notes (Signed)
[facility]  ROUNDING NOTE   Subjective:   [name] was admitted to [facility] on 02/05/2021 for Hyperkalemia [E87.5] Generalized weakness [R53.1] Acute bronchitis, unspecified organism [J20.9]  Last peritoneal treatment was last night  Patient was found to have hypokalemia and then his primary nephrologist ordered potassium chloride. Since starting this medication, patient has been tired and weak. Yesterday, patient had follow up labs with a serum potassium of 6.2.   Brought to ED with a potassium of 6.4.   Patient seen today resting in bed with wife at the bedside He continues to have a cough and says it's relieved with medication He says the PD cyclar didn't work last night and he was unable to drain properly. He feels he did not get a good treatment. He is alert and oriented He denies shortness of breath and chest pain   Objective:  Vital signs in last 24 hours:  Temp:  [97.5 F (36.4 C)-98.2 F (36.8 C)] 98.2 F (36.8 C) (02/17 1305) Pulse Rate:  [73-83] 83 (02/17 1305) Resp:  [16-25] 16 (02/17 1305) BP: (104-131)/(62-67) 118/66 (02/17 1305) SpO2:  [92 %-100 %] 97 % (02/17 1305) Weight:  [80.8 kg] 80.8 kg (02/16 1456)  Weight change:  Filed Weights   02/05/21 0929 02/05/21 1456  Weight: 81.6 kg 80.8 kg    Intake/Output: I/O last 3 completed shifts: In: 560 [P.O.:60; IV Piggyback:500] Out: 100 [Urine:100]   Intake/Output this shift:  Total I/O In: 240 [P.O.:240] Out: 1 [Urine:1]  Physical Exam: General: NAD,   Head: Normocephalic, atraumatic. Moist oral mucosal membranes  Eyes: Anicteric, PERRL  Neck: Supple, trachea midline  Lungs:  Wheezing in bilateral bases, nonproductive moist cough  Heart: Regular rate and rhythm  Abdomen:  Soft, nontender,   Extremities:  + peripheral edema.  Neurologic: Nonfocal, moving all four extremities  Skin: No lesions  Access: PD catheter    Basic Metabolic Panel: Recent Labs  Lab 02/05/21 0928  02/05/21 1509 02/06/21 0458  NA 132* 131* 131*  K 6.4* 6.1* 5.2*  CL 97* 99 97*  CO2 '26 24 26  '$ GLUCOSE 153* 61* 172*  BUN 33* 34* 35*  CREATININE 4.66* 4.72* 4.47*  CALCIUM 7.4* 7.3* 7.2*    Liver Function Tests: Recent Labs  Lab 02/05/21 1107  AST 27  ALT 18  ALKPHOS 76  BILITOT 0.7  PROT 5.0*  ALBUMIN 1.6*   No results for input(s): LIPASE, AMYLASE in the last 168 hours. No results for input(s): AMMONIA in the last 168 hours.  CBC: Recent Labs  Lab 02/05/21 0928 02/06/21 0458  WBC 14.1* 9.2  HGB 8.6* 8.6*  HCT 26.2* 25.4*  MCV 101.9* 100.0  PLT 322 313    Cardiac Enzymes: Recent Labs  Lab 02/05/21 1509  CKTOTAL 207    BNP: Invalid input(s): POCBNP  CBG: Recent Labs  Lab 02/05/21 1817 02/05/21 2111 02/06/21 0909 02/06/21 1146  GLUCAP 139* 191* 120* 140*    Microbiology: Results for orders placed or performed during the hospital encounter of 02/05/21  Resp Panel by RT-PCR (Flu A&B, Covid) Nasopharyngeal Swab     Status: None   Collection Time: 02/05/21 11:07 AM   Specimen: Nasopharyngeal Swab; Nasopharyngeal(NP) swabs in vial transport medium  Result Value Ref Range Status   SARS Coronavirus 2 by RT PCR NEGATIVE NEGATIVE Final    Comment: (NOTE) SARS-CoV-2 target nucleic acids are NOT DETECTED.  The SARS-CoV-2 RNA is generally detectable in upper respiratory specimens during the acute phase  of infection. The lowest concentration of SARS-CoV-2 viral copies this assay can detect is 138 copies/mL. A negative result does not preclude SARS-Cov-2 infection and should not be used as the sole basis for treatment or other patient management decisions. A negative result may occur with  improper specimen collection/handling, submission of specimen other than nasopharyngeal swab, presence of viral mutation(s) within the areas targeted by this assay, and inadequate number of viral copies(<138 copies/mL). A negative result must be combined  with clinical observations, patient history, and epidemiological information. The expected result is Negative.  Fact Sheet for Patients:  EntrepreneurPulse.com.au  Fact Sheet for Healthcare Providers:  IncredibleEmployment.be  This test is no t yet approved or cleared by the [place] FDA and  has been authorized for detection and/or diagnosis of SARS-CoV-2 by FDA under an Emergency Use Authorization (EUA). This EUA will remain  in effect (meaning this test can be used) for the duration of the COVID-19 declaration under Section 564(b)(1) of the Act, 21 U.S.C.section 360bbb-3(b)(1), unless the authorization is terminated  or revoked sooner.       Influenza A by PCR NEGATIVE NEGATIVE Final   Influenza B by PCR NEGATIVE NEGATIVE Final    Comment: (NOTE) The Xpert Xpress SARS-CoV-2/FLU/RSV plus assay is intended as an aid in the diagnosis of influenza from Nasopharyngeal swab specimens and should not be used as a sole basis for treatment. Nasal washings and aspirates are unacceptable for Xpert Xpress SARS-CoV-2/FLU/RSV testing.  Fact Sheet for Patients: EntrepreneurPulse.com.au  Fact Sheet for Healthcare Providers: IncredibleEmployment.be  This test is not yet approved or cleared by the [place] FDA and has been authorized for detection and/or diagnosis of SARS-CoV-2 by FDA under an Emergency Use Authorization (EUA). This EUA will remain in effect (meaning this test can be used) for the duration of the COVID-19 declaration under Section 564(b)(1) of the Act, 21 U.S.C. section 360bbb-3(b)(1), unless the authorization is terminated or revoked.  Performed at [facility], [address] [place], [place] [address]     Coagulation Studies: No results for input(s): LABPROT, INR in the last 72 hours.  Urinalysis: No results for input(s): COLORURINE, LABSPEC, PHURINE, GLUCOSEU, HGBUR,  BILIRUBINUR, KETONESUR, PROTEINUR, UROBILINOGEN, NITRITE, LEUKOCYTESUR in the last 72 hours.  Invalid input(s): APPERANCEUR    Imaging: US RENAL  Result Date: 02/06/2021 CLINICAL DATA:  Inpatient. End-stage renal disease. Peritoneal dialysis. EXAM: RENAL / URINARY TRACT ULTRASOUND COMPLETE COMPARISON:  None. FINDINGS: Right Kidney: Renal measurements: 8.4 x 5.3 x 4.8 cm = volume: 110 mL. Limited visualization of the lower right kidney due to overlying bowel gas. No hydronephrosis. Thin (8 mm) echogenic renal parenchyma. Limited visualization of a simple appearing 3.2 x 2.4 x 3.5 cm interpolar right renal cyst. Left Kidney: Renal measurements: 7.8 x 4.3 x 5.0 cm = volume: 86 mL. No hydronephrosis. Thin (8 mm) echogenic renal parenchyma. No renal mass. Bladder: Appears normal for degree of bladder distention. Other: Small volume ascites. IMPRESSION: 1. No hydronephrosis. 2. Small echogenic kidneys, compatible with reported nonspecific chronic renal parenchymal disease. 3. Simple appearing 3.5 cm interpolar right renal cyst. 4. Small volume ascites, presumably related to peritoneal dialysis. Electronically Signed   By: Ilona Sorrel M.D.   On: 02/06/2021 12:20   DG Chest Portable 1 View  Result Date: 02/05/2021 CLINICAL DATA:  Shortness of breath, wheezing EXAM: PORTABLE CHEST 1 VIEW COMPARISON:  None. FINDINGS: Prior CABG. Heart is normal size. Aortic atherosclerosis. No confluent opacities or effusions. No acute bony abnormality. IMPRESSION: No  active disease. Electronically Signed   By: Rolm Baptise M.D.   On: 02/05/2021 11:05     Medications:   . dialysis solution 2.5% low-MG/low-CA     . amLODipine  5 mg Oral Daily  . aspirin EC  81 mg Oral QODAY  . carvedilol  12.5 mg Oral BID WC  . furosemide  60 mg Intravenous BID  . gentamicin cream  1 application Topical Daily  . heparin  5,000 Units Subcutaneous Q8H  . insulin aspart  0-5 Units Subcutaneous QHS  . insulin aspart  0-6 Units  Subcutaneous TID WC  . ipratropium-albuterol  3 mL Nebulization BID  . simvastatin  20 mg Oral QHS  . sodium zirconium cyclosilicate  10 g Oral Daily   guaiFENesin-dextromethorphan, ondansetron **OR** ondansetron (ZOFRAN) IV  Assessment/ Plan:  Wesley Henderson is a 87 y.o. white male with end stage renal disease on peritoneal dialysis, hypertension, diabetes mellitus type II, hyperlipidemia, congestive heart failure who is admitted to [facility] on 02/05/2021 for Hyperkalemia [E87.5] Generalized weakness [R53.1] Acute bronchitis, unspecified organism [J20.9]  [place] 80kg Peritoneal Dialysis CCPD 8 hours 4 exchanges 2722m fills   1. End stage renal disease: with hyperkalemia - Peritoneal dialysis tonight. Orders prepared. 2.5% dextrose.  - [place] - Low potassium diet - Furosemide '60mg'$  IV - hold telmisartan -Albumin 25g ordered for level 1.6. -concerned about sudden decline from January to now. -order renal [place]-wife was told there were masses during a previous scan -order electrophoresis of blood and urine.  2. Hypertension: 104/62 - hypotensive. Home regimen of amlodipine, carvedilol, furosemide, and telmisartan  3. Anemia of chronic kidney disease:  - hemoglobin 8.6 and macrocytic - EPO and venofer as outpatient -Will order iron studies  4. Secondary Hyperparathyroidism: with hypocalcemia. Corrected calcium of 9.2. Not currently on binders.   5. Nutrition: albumin of 1.6 - cause for patient's hypotension and peripheral edema.    LOS: 0 Shannin Naab 2/17/20222:21 PM

## 2021-02-06 NOTE — Evaluation (Signed)
Physical Therapy Evaluation Patient Details Name: Wesley Henderson MRN: [id number] DOB: [birth year] Today's Date: 02/06/2021   History of Present Illness  Pt admitted for hyperkalemia with complaints of weakness/falls. History includes ESRD on PD, DM, CHF, CAD, anxiety, and HTN  Clinical Impression  Pt is a pleasant 87 year old male who was admitted for hyperkalemia and fall. Pt performs bed mobility with supervision, transfers with cga, and ambulation with cga and RW. Pt demonstrates deficits with endurance/mobility/balance. All O2 sats WNL with exertion. Very close to baseline level, however encouraged to continue with RW use for improved safety. Would benefit from skilled PT to address above deficits and promote optimal return to PLOF. Recommend transition to [facility] upon discharge from acute hospitalization.     Follow Up Recommendations Home health PT    Equipment Recommendations   (reports he has all equipment)    Recommendations for Other Services       Precautions / Restrictions Precautions Precautions: Fall Restrictions Weight Bearing Restrictions: No      Mobility  Bed Mobility Overal bed mobility: Needs Assistance Bed Mobility: Supine to Sit     Supine to sit: Supervision     General bed mobility comments: safe technique with upright posture noted.    Transfers Overall transfer level: Needs assistance Equipment used: Rolling walker (2 wheeled) Transfers: Sit to/from Stand Sit to Stand: Min guard         General transfer comment: tends to pull up on RW. Once standing, upright posture noted, however becomes flexed with movement  Ambulation/Gait Ambulation/Gait assistance: Min guard Gait Distance (Feet): 200 Feet Assistive device: Rolling walker (2 wheeled) Gait Pattern/deviations: Step-through pattern     General Gait Details: fast cadence with unsteadiness noted. Flexed posture. Cues to slow down for safety, however inconsistent. Reports RPE at 5/10 with  exertion. O2 sats WNL with exertion.  Stairs            Wheelchair Mobility    Modified Rankin (Stroke Patients Only)       Balance Overall balance assessment: Needs assistance;History of Falls Sitting-balance support: Feet supported Sitting balance-Leahy Scale: Good     Standing balance support: Bilateral upper extremity supported Standing balance-Leahy Scale: Good                               Pertinent Vitals/Pain Pain Assessment: No/denies pain    Home Living Family/patient expects to be discharged to:: Private residence Living Arrangements: Spouse/significant other Available Help at Discharge: Family Type of Home: House Home Access: Stairs to enter Entrance Stairs-Rails: Can reach both Entrance Stairs-Number of Steps: 3 Home Layout: Two level (sleeps upstairs (where PD is set up)) Home Equipment: Walker - 2 wheels;Cane - single point      Prior Function Level of Independence: Independent         Comments: reports he is usually active, hasn't used AD, however they are available     Hand Dominance        Extremity/Trunk Assessment   Upper Extremity Assessment Upper Extremity Assessment: Overall WFL for tasks assessed    Lower Extremity Assessment Lower Extremity Assessment: Generalized weakness (B LE grossly 5/5 in bed, however 4/5 with functional movement)       Communication   Communication: No difficulties  Cognition Arousal/Alertness: Awake/alert Behavior During Therapy: WFL for tasks assessed/performed Overall Cognitive Status: Within Functional Limits for tasks assessed  General Comments      Exercises     Assessment/Plan    PT Assessment Patient needs continued PT services  PT Problem List Decreased strength;Decreased balance;Decreased mobility;Decreased safety awareness       PT Treatment Interventions DME instruction;Gait training;Therapeutic  exercise;Balance training    PT Goals (Current goals can be found in the Care Plan section)  Acute Rehab PT Goals Patient Stated Goal: to go home PT Goal Formulation: With patient Time For Goal Achievement: 02/20/21 Potential to Achieve Goals: Good    Frequency Min 2X/week   Barriers to discharge        Co-evaluation               AM-PAC PT "6 Clicks" Mobility  Outcome Measure Help needed turning from your back to your side while in a flat bed without using bedrails?: None Help needed moving from lying on your back to sitting on the side of a flat bed without using bedrails?: A Little Help needed moving to and from a bed to a chair (including a wheelchair)?: A Little Help needed standing up from a chair using your arms (e.g., wheelchair or bedside chair)?: A Little Help needed to walk in hospital room?: A Little Help needed climbing 3-5 steps with a railing? : A Little 6 Click Score: 19    End of Session Equipment Utilized During Treatment: Gait belt Activity Tolerance: Patient tolerated treatment well Patient left: in bed;with bed alarm set (no recliner in room available) Nurse Communication: Mobility status PT Visit Diagnosis: Unsteadiness on feet (R26.81);Muscle weakness (generalized) (M62.81);History of falling (Z91.81);Difficulty in walking, not elsewhere classified (R26.2)    Time: [id number] PT Time Calculation (min) (ACUTE ONLY): 21 min   Charges:   PT Evaluation $PT Eval Low Complexity: 1 Low PT Treatments $Gait Training: 8-22 mins        Greggory Stallion, PT, DPT [phone]   Ray,Stephanie 02/06/2021, 4:46 PM

## 2021-02-06 NOTE — Plan of Care (Signed)
PD last  night and stopped at 0400, pt had non-productive cough all night and MD notified and Prn given with some relief.

## 2021-02-06 NOTE — Progress Notes (Signed)
Patient ID: BUZ DORGAN, male   DOB: 07-Aug-1930, 87 y.o.   MRN: [id number] [facility] PROGRESS NOTE  CANDON DISCHLER [id number] DOB: [birth year] DOA: 02/05/2021 PCP: [name], MD  HPI/Subjective: In the hospital for hyperkalemia.  He was coughing all last night.  Having some shortness of breath and some wheeze.  He has been feeling weak and having some falls.  Objective: Vitals:   02/06/21 0854 02/06/21 1305  BP:  118/66  Pulse:  83  Resp:  16  Temp:  98.2 F (36.8 C)  SpO2: 92% 97%    Intake/Output Summary (Last 24 hours) at 02/06/2021 1338 Last data filed at 02/06/2021 1006 Gross per 24 hour  Intake 300 ml  Output 101 ml  Net 199 ml   Filed Weights   02/05/21 0929 02/05/21 1456  Weight: 81.6 kg 80.8 kg    ROS: Review of Systems  Respiratory: Positive for cough, shortness of breath and wheezing.   Cardiovascular: Negative for chest pain.  Gastrointestinal: Negative for abdominal pain, nausea and vomiting.   Exam: Physical Exam HENT:     Head: Normocephalic.     Mouth/Throat:     Pharynx: No oropharyngeal exudate.  Eyes:     General: Lids are normal.     Conjunctiva/sclera: Conjunctivae normal.  Cardiovascular:     Rate and Rhythm: Normal rate and regular rhythm.     Heart sounds: Normal heart sounds, S1 normal and S2 normal.  Pulmonary:     Breath sounds: Examination of the right-middle field reveals wheezing. Examination of the left-middle field reveals wheezing. Examination of the right-lower field reveals decreased breath sounds and wheezing. Examination of the left-lower field reveals decreased breath sounds and wheezing. Decreased breath sounds and wheezing present. No rhonchi or rales.  Abdominal:     Palpations: Abdomen is soft.     Tenderness: There is no abdominal tenderness.  Musculoskeletal:     Right ankle: Swelling present.     Left ankle: Swelling present.  Skin:    General: Skin is warm.     Findings: No rash.   Neurological:     Mental Status: He is alert and oriented to person, place, and time.       Data Reviewed: Basic Metabolic Panel: Recent Labs  Lab 02/05/21 0928 02/05/21 1509 02/06/21 0458  NA 132* 131* 131*  K 6.4* 6.1* 5.2*  CL 97* 99 97*  CO2 '26 24 26  '$ GLUCOSE 153* 61* 172*  BUN 33* 34* 35*  CREATININE 4.66* 4.72* 4.47*  CALCIUM 7.4* 7.3* 7.2*   Liver Function Tests: Recent Labs  Lab 02/05/21 1107  AST 27  ALT 18  ALKPHOS 76  BILITOT 0.7  PROT 5.0*  ALBUMIN 1.6*   CBC: Recent Labs  Lab 02/05/21 0928 02/06/21 0458  WBC 14.1* 9.2  HGB 8.6* 8.6*  HCT 26.2* 25.4*  MCV 101.9* 100.0  PLT 322 313   Cardiac Enzymes: Recent Labs  Lab 02/05/21 1509  CKTOTAL 207   BNP (last 3 results) Recent Labs    02/05/21 1345  BNP 368.6*     CBG: Recent Labs  Lab 02/05/21 1817 02/05/21 2111 02/06/21 0909 02/06/21 1146  GLUCAP 139* 191* 120* 140*    Recent Results (from the past 240 hour(s))  Resp Panel by RT-PCR (Flu A&B, Covid) Nasopharyngeal Swab     Status: None   Collection Time: 02/05/21 11:07 AM   Specimen: Nasopharyngeal Swab; Nasopharyngeal(NP) swabs in vial transport medium  Result Value Ref Range  Status   SARS Coronavirus 2 by RT PCR NEGATIVE NEGATIVE Final    Comment: (NOTE) SARS-CoV-2 target nucleic acids are NOT DETECTED.  The SARS-CoV-2 RNA is generally detectable in upper respiratory specimens during the acute phase of infection. The lowest concentration of SARS-CoV-2 viral copies this assay can detect is 138 copies/mL. A negative result does not preclude SARS-Cov-2 infection and should not be used as the sole basis for treatment or other patient management decisions. A negative result may occur with  improper specimen collection/handling, submission of specimen other than nasopharyngeal swab, presence of viral mutation(s) within the areas targeted by this assay, and inadequate number of viral copies(<138 copies/mL). A negative result  must be combined with clinical observations, patient history, and epidemiological information. The expected result is Negative.  Fact Sheet for Patients:  EntrepreneurPulse.com.au  Fact Sheet for Healthcare Providers:  IncredibleEmployment.be  This test is no t yet approved or cleared by the [place] FDA and  has been authorized for detection and/or diagnosis of SARS-CoV-2 by FDA under an Emergency Use Authorization (EUA). This EUA will remain  in effect (meaning this test can be used) for the duration of the COVID-19 declaration under Section 564(b)(1) of the Act, 21 U.S.C.section 360bbb-3(b)(1), unless the authorization is terminated  or revoked sooner.       Influenza A by PCR NEGATIVE NEGATIVE Final   Influenza B by PCR NEGATIVE NEGATIVE Final    Comment: (NOTE) The Xpert Xpress SARS-CoV-2/FLU/RSV plus assay is intended as an aid in the diagnosis of influenza from Nasopharyngeal swab specimens and should not be used as a sole basis for treatment. Nasal washings and aspirates are unacceptable for Xpert Xpress SARS-CoV-2/FLU/RSV testing.  Fact Sheet for Patients: EntrepreneurPulse.com.au  Fact Sheet for Healthcare Providers: IncredibleEmployment.be  This test is not yet approved or cleared by the [place] FDA and has been authorized for detection and/or diagnosis of SARS-CoV-2 by FDA under an Emergency Use Authorization (EUA). This EUA will remain in effect (meaning this test can be used) for the duration of the COVID-19 declaration under Section 564(b)(1) of the Act, 21 U.S.C. section 360bbb-3(b)(1), unless the authorization is terminated or revoked.  Performed at [facility], [address], [place], [place] [address]      Studies: US RENAL  Result Date: 02/06/2021 CLINICAL DATA:  Inpatient. End-stage renal disease. Peritoneal dialysis. EXAM: RENAL / URINARY TRACT  ULTRASOUND COMPLETE COMPARISON:  None. FINDINGS: Right Kidney: Renal measurements: 8.4 x 5.3 x 4.8 cm = volume: 110 mL. Limited visualization of the lower right kidney due to overlying bowel gas. No hydronephrosis. Thin (8 mm) echogenic renal parenchyma. Limited visualization of a simple appearing 3.2 x 2.4 x 3.5 cm interpolar right renal cyst. Left Kidney: Renal measurements: 7.8 x 4.3 x 5.0 cm = volume: 86 mL. No hydronephrosis. Thin (8 mm) echogenic renal parenchyma. No renal mass. Bladder: Appears normal for degree of bladder distention. Other: Small volume ascites. IMPRESSION: 1. No hydronephrosis. 2. Small echogenic kidneys, compatible with reported nonspecific chronic renal parenchymal disease. 3. Simple appearing 3.5 cm interpolar right renal cyst. 4. Small volume ascites, presumably related to peritoneal dialysis. Electronically Signed   By: Ilona Sorrel M.D.   On: 02/06/2021 12:20   DG Chest Portable 1 View  Result Date: 02/05/2021 CLINICAL DATA:  Shortness of breath, wheezing EXAM: PORTABLE CHEST 1 VIEW COMPARISON:  None. FINDINGS: Prior CABG. Heart is normal size. Aortic atherosclerosis. No confluent opacities or effusions. No acute bony abnormality. IMPRESSION: No active disease.  Electronically Signed   By: Rolm Baptise M.D.   On: 02/05/2021 11:05    Scheduled Meds: . amLODipine  5 mg Oral Daily  . aspirin EC  81 mg Oral QODAY  . carvedilol  12.5 mg Oral BID WC  . furosemide  60 mg Intravenous BID  . gentamicin cream  1 application Topical Daily  . heparin  5,000 Units Subcutaneous Q8H  . insulin aspart  0-5 Units Subcutaneous QHS  . insulin aspart  0-6 Units Subcutaneous TID WC  . ipratropium-albuterol  3 mL Nebulization BID  . simvastatin  20 mg Oral QHS  . sodium zirconium cyclosilicate  10 g Oral Daily   Continuous Infusions: . dialysis solution 2.5% low-MG/low-CA      Assessment/Plan:  1. Acute CHF.  I switch to IV Lasix this morning and nephrology increase to twice daily  dosing.  Ordered an echocardiogram.  Currently on Coreg. 2. Wheeze could be cardiac wheeze but I did order some nebulizer treatments. 3. End-stage renal disease with hyperkalemia.  Peritoneal dialysis to manage potassium and fluid.  Lokelma daily.  Holding Micardis. 4. Weakness and falls physical therapy evaluation. 5. Essential hypertension on Norvasc Coreg and IV Lasix. 6. Hyperlipidemia unspecified on simvastatin. 7. Type 2 diabetes mellitus.  Hemoglobin A1c actually low at 6.0. 8. Renal cyst on hopefully on sonogram    Code Status:     Code Status Orders  (From admission, onward)         Start     Ordered   02/05/21 1422  Full code  Continuous        02/05/21 1423        Code Status History    This patient has a current code status but no historical code status.   Advance Care Planning Activity     Family Communication: Spoke with family at the bedside Disposition Plan: Status is: Inpatient  Dispo: The patient is from: Home              Anticipated d/c is to: Home              Anticipated d/c date is: Potentially 12/07/2021 versus 02/08/2021.  Depending on clinical course              Patient currently on IV Lasix for diuresis.  Echocardiogram ordered.   Difficult to place patient.  Hopefully not.  Time spent: 28 minutes  [place]

## 2021-02-07 ENCOUNTER — Other Ambulatory Visit (INDEPENDENT_AMBULATORY_CARE_PROVIDER_SITE_OTHER): Payer: Self-pay | Admitting: Vascular Surgery

## 2021-02-07 ENCOUNTER — Inpatient Hospital Stay
Admit: 2021-02-07 | Discharge: 2021-02-07 | Disposition: A | Discharge status: Home / Self Care | Payer: Medicare Other | Attending: Internal Medicine | Admitting: Internal Medicine

## 2021-02-07 ENCOUNTER — Inpatient Hospital Stay
Admission: EM | Disposition: A | Discharge status: Home Health | Payer: Self-pay | Source: Home / Self Care | Attending: Internal Medicine

## 2021-02-07 ENCOUNTER — Inpatient Hospital Stay: Payer: Medicare Other

## 2021-02-07 DIAGNOSIS — E875 Hyperkalemia: Principal | ICD-10-CM

## 2021-02-07 DIAGNOSIS — D638 Anemia in other chronic diseases classified elsewhere: Secondary | ICD-10-CM

## 2021-02-07 DIAGNOSIS — N185 Chronic kidney disease, stage 5: Secondary | ICD-10-CM

## 2021-02-07 HISTORY — PX: DIALYSIS/PERMA CATHETER INSERTION: CATH118288

## 2021-02-07 LAB — GLUCOSE, CAPILLARY
Glucose-Capillary: 117 mg/dL — ABNORMAL HIGH (ref 70–99)
Glucose-Capillary: 127 mg/dL — ABNORMAL HIGH (ref 70–99)
Glucose-Capillary: 109 mg/dL — ABNORMAL HIGH (ref 70–99)
Glucose-Capillary: 119 mg/dL — ABNORMAL HIGH (ref 70–99)
Glucose-Capillary: 143 mg/dL — ABNORMAL HIGH (ref 70–99)

## 2021-02-07 LAB — RENAL FUNCTION PANEL
Albumin: 1.8 g/dL — ABNORMAL LOW (ref 3.5–5.0)
Anion gap: 9 (ref 5–15)
BUN: 34 mg/dL — ABNORMAL HIGH (ref 8–23)
CO2: 26 mmol/L (ref 22–32)
Calcium: 7.2 mg/dL — ABNORMAL LOW (ref 8.9–10.3)
Chloride: 96 mmol/L — ABNORMAL LOW (ref 98–111)
Creatinine, Ser: 4.66 mg/dL — ABNORMAL HIGH (ref 0.61–1.24)
GFR, Estimated: 11 mL/min — ABNORMAL LOW (ref >60)
Glucose, Bld: 136 mg/dL — ABNORMAL HIGH (ref 70–99)
Phosphorus: 4.1 mg/dL (ref 2.5–4.6)
Potassium: 4.5 mmol/L (ref 3.5–5.1)
Sodium: 131 mmol/L — ABNORMAL LOW (ref 135–145)

## 2021-02-07 LAB — KAPPA/LAMBDA LIGHT CHAINS
Kappa free light chain: 136.9 mg/L — ABNORMAL HIGH (ref 3.3–19.4)
Kappa, lambda light chain ratio: 0.94 (ref 0.26–1.65)
Lambda free light chains: 145.3 mg/L — ABNORMAL HIGH (ref 5.7–26.3)

## 2021-02-07 LAB — HEPATITIS C ANTIBODY: HCV Ab: NONREACTIVE

## 2021-02-07 LAB — HEPATITIS B SURFACE ANTIGEN: Hepatitis B Surface Ag: NONREACTIVE

## 2021-02-07 LAB — PHOSPHORUS: Phosphorus: 4.2 mg/dL (ref 2.5–4.6)

## 2021-02-07 LAB — HEPATITIS B CORE ANTIBODY, TOTAL: Hep B Core Total Ab: NONREACTIVE

## 2021-02-07 SURGERY — DIALYSIS/PERMA CATHETER INSERTION
Anesthesia: Moderate Sedation

## 2021-02-07 MED ORDER — ONDANSETRON HCL 4 MG/2ML IJ SOLN: 4.0000 mg | Freq: Four times a day (QID) | INTRAMUSCULAR | Status: DC | PRN

## 2021-02-07 MED ORDER — DIPHENHYDRAMINE HCL 50 MG/ML IJ SOLN: 50.0000 mg | Freq: Once | INTRAMUSCULAR | Status: DC | PRN

## 2021-02-07 MED ORDER — SODIUM CHLORIDE 0.9 % IV SOLN: INTRAVENOUS | Status: DC

## 2021-02-07 MED ORDER — METHYLPREDNISOLONE SODIUM SUCC 125 MG IJ SOLR: 125.0000 mg | Freq: Once | INTRAMUSCULAR | Status: DC | PRN

## 2021-02-07 MED ORDER — HYDROMORPHONE HCL 1 MG/ML IJ SOLN: 1.0000 mg | Freq: Once | INTRAMUSCULAR | Status: DC | PRN

## 2021-02-07 MED ORDER — CEFAZOLIN SODIUM-DEXTROSE 1-4 GM/50ML-% IV SOLN
1.0000 g | Freq: Once | INTRAVENOUS | Status: AC
  Administered 2021-02-07: 1 g via INTRAVENOUS

## 2021-02-07 MED ORDER — CHLORHEXIDINE GLUCONATE CLOTH 2 % EX PADS
6.0000 | MEDICATED_PAD | Freq: Every day | CUTANEOUS | Status: DC
  Administered 2021-02-08 – 2021-02-10 (×3): 6 via TOPICAL

## 2021-02-07 MED ORDER — FAMOTIDINE 20 MG PO TABS: 40.0000 mg | ORAL_TABLET | Freq: Once | ORAL | Status: DC | PRN

## 2021-02-07 MED ORDER — FENTANYL CITRATE (PF) 100 MCG/2ML IJ SOLN
INTRAMUSCULAR | Status: DC | PRN
  Administered 2021-02-07: 12.5 ug via INTRAVENOUS
  Administered 2021-02-07: 25 ug via INTRAVENOUS

## 2021-02-07 MED ORDER — MIDAZOLAM HCL 2 MG/ML PO SYRP: 8.0000 mg | ORAL_SOLUTION | Freq: Once | ORAL | Status: DC | PRN

## 2021-02-07 MED ORDER — MIDAZOLAM HCL 2 MG/2ML IJ SOLN
INTRAMUSCULAR | Status: DC | PRN
  Administered 2021-02-07: 1 mg via INTRAVENOUS
  Administered 2021-02-07: 0.5 mg via INTRAVENOUS

## 2021-02-07 MED ORDER — CEFAZOLIN SODIUM-DEXTROSE 1-4 GM/50ML-% IV SOLN
INTRAVENOUS | Status: AC
  Filled 2021-02-07: qty 50

## 2021-02-07 MED ORDER — MIDAZOLAM HCL 5 MG/5ML IJ SOLN
INTRAMUSCULAR | Status: AC
  Filled 2021-02-07: qty 5

## 2021-02-07 MED ORDER — FENTANYL CITRATE (PF) 100 MCG/2ML IJ SOLN
INTRAMUSCULAR | Status: AC
  Filled 2021-02-07: qty 2

## 2021-02-07 SURGICAL SUPPLY — 10 items
BIOPATCH RED 1 DISK 7.0 (GAUZE/BANDAGES/DRESSINGS) ×2 IMPLANT
CATH PALIN MAXID VT KIT 19CM (CATHETERS) ×2 IMPLANT
DERMABOND ADVANCED (GAUZE/BANDAGES/DRESSINGS) ×1
DERMABOND ADVANCED .7 DNX12 (GAUZE/BANDAGES/DRESSINGS) ×1 IMPLANT
GUIDEWIRE SUPER STIFF .035X180 (WIRE) ×4 IMPLANT
NEEDLE ENTRY 21GA 7CM ECHOTIP (NEEDLE) ×4 IMPLANT
PACK ANGIOGRAPHY (CUSTOM PROCEDURE TRAY) ×2 IMPLANT
SET INTRO CAPELLA COAXIAL (SET/KITS/TRAYS/PACK) ×2 IMPLANT
SUT MNCRL AB 4-0 PS2 18 (SUTURE) ×2 IMPLANT
SUT SILK 0 FSL (SUTURE) ×2 IMPLANT

## 2021-02-07 NOTE — Progress Notes (Signed)
PT Cancellation Note  Patient Details Name: YENIEL HARBOLD MRN: [id number] DOB: 18-Oct-1930   Cancelled Treatment:    Reason Eval/Treat Not Completed: Medical issues which prohibited therapy;Patient at procedure or test/unavailable (Pt OTF for vascular surgery. Will continue to follow remotely. Please place PT order 'continue upon transfer' or place new PT order when patient is medically appropriate to resume.)   Buccola,Allan C 02/07/2021, 2:49 PM

## 2021-02-07 NOTE — Op Note (Signed)
[facility]   OPERATIVE NOTE     PROCEDURE: 1. Insertion of a right IJ tunneled dialysis catheter. 2. Catheter placement and cannulation under ultrasound and fluoroscopic guidance  PRE-OPERATIVE DIAGNOSIS: end-stage renal requiring hemodialysis  POST-OPERATIVE DIAGNOSIS: same as above  SURGEON: Hortencia Pilar  ANESTHESIA: Conscious sedation was administered under my direct supervision by the interventional radiology RN. IV Versed plus fentanyl were utilized. Continuous ECG, pulse oximetry and blood pressure was monitored throughout the entire procedure.  Conscious sedation was for a total of 40 minutes and 50 seconds.  ESTIMATED BLOOD LOSS: Minimal  FINDING(S): 1.  Tips of the catheter in the right atrium on fluoroscopy 2.  No obvious pneumothorax on fluoroscopy  SPECIMEN(S):  none  INDICATIONS:   Wesley Henderson is a 87 y.o. male  presents with end stage renal disease.  Therefore, the patient requires a tunneled dialysis catheter placement.  The patient is informed of  the risks catheter placement include but are not limited to: bleeding, infection, central venous injury, pneumothorax, possible venous stenosis, possible malpositioning in the venous system, and possible infections related to long-term catheter presence.  The patient was aware of these risks and agreed to proceed.  DESCRIPTION: The patient was taken back to Special Procedure suite.  Prior to sedation, the patient was given IV antibiotics.  After obtaining adequate sedation, the patient was prepped and draped in the standard fashion for a right IJ tunneled dialysis catheter placement.  Appropriate Time Out is called.     The the right neck and chest wall are then infiltrated with 1% Lidocaine with epinepherine.  A 19 cm tip to cuff catheter is then selected, opened on the back table and prepped. Ultrasound is placed in a sterile sleeve.  Under ultrasound guidance, the right internal jugular  vein is examined and is noted to be echolucent and easily compressible indicating patency.   An image is recorded for the permanent record.  The right internal jugular vein is cannulated with the microneedle under direct ultrasound vissualization.  A Microwire followed by a micro sheath is then inserted without difficulty.   A J-wire was then advanced under fluoroscopic guidance into the inferior vena cava and the wire was secured.  Small counter incision was then made at the wire insertion site. A small pocket was fashioned with blunt dissection to allow easier passage of the cuff.  The dilator and peel-away sheath are then advanced over the wire under fluoroscopic guidance. The catheters and advanced through the peel-away sheath. It is approximated to the chest wall after verifying the tips at the atrial caval junction and an exit site is selected.  Small incision is made at the selected exit site and the tunneling device was passed subcutaneously to the counter incision. Catheter is then pulled through the subcutaneous tunnel. The catheter is then verified for tip position under fluoroscopy, transected and the hub assembly connected.    Each port was tested by aspirating and flushing.  No resistance was noted.  Each port was then thoroughly flushed with heparinized saline.  The catheter was secured in placed with two interrupted stitches of 0 silk tied to the catheter.  The counter incision was closed with a U-stitch of 4-0 Monocryl.  The insertion site is then cleaned and sterile bandages applied including a Biopatch.  Each port was then packed with concentrated heparin (1000 Units/mL) at the manufacturer recommended volumes to each port.  Sterile caps were applied to each port.  On completion  fluoroscopy, the tips of the catheter were in the right atrium, and there was no evidence of pneumothorax.  COMPLICATIONS: None  CONDITION: Unchanged   Hortencia Pilar [facility] Office:  [phone]   02/07/2021, 7:15 PM

## 2021-02-07 NOTE — Consult Note (Signed)
[facility] Consult Note  MRN : [id number]  Wesley Henderson is a 87 y.o. ([birth year]) male who presents with chief complaint of  Chief Complaint  Patient presents with  . Weakness   History of Present Illness:  Wesley Henderson is a 87 year old male with medical history significant for end-stage renal disease on peritoneal dialysis, diabetes mellitus, CHF and coronary artery disease who presents to the ER for evaluation of several days of generalized weakness which has progressively worsened over the last couple of days and associated with multiple falls.    Patient denied feeling dizzy or lightheaded prior to his falls.  He denies having any loss of consciousness with the falls.  Patient also complains of increased abdominal girth, bilateral lower extremity swelling and shortness of breath with exertion associated with wheezing for the last 1 to 2 days.  He denies having any cough, no fever, no chills.  He had blood work done about 2 weeks ago and was told his potassium was low and was started on potassium replacement which he was taking for about 10 days.  His wife states that since he started taking the potassium that he has had a progressive decline. He denies having any abdominal pain, no nausea, no vomiting, no palpitations, no diaphoresis, no headache, no urinary frequency, no nocturia, no dysuria, no focal deficits, no headache, no blurred vision. Patient states that he has been compliant with his dialysis treatment but missed one treatment the day prior to his admission.  Vascular surgery was consulted by Dr. Juleen China for PermCath insertion.   Current Facility-Administered Medications  Medication Dose Route Frequency Provider Last Rate Last Admin  . 0.9 %  sodium chloride infusion   Intravenous Continuous Vicke Plotner A, PA-C      . amLODipine (NORVASC) tablet 5 mg  5 mg Oral Daily Agbata, Tochukwu, MD   5 mg at 02/07/21 0936  . aspirin EC tablet  81 mg  81 mg Oral QODAY Agbata, Tochukwu, MD   81 mg at 02/07/21 0936  . carvedilol (COREG) tablet 12.5 mg  12.5 mg Oral BID WC Agbata, Tochukwu, MD   12.5 mg at 02/07/21 0936  . [START ON 02/08/2021] ceFAZolin (ANCEF) IVPB 1 g/50 mL premix  1 g Intravenous Once Ellerie Arenz A, PA-C      . [START ON 02/08/2021] Chlorhexidine Gluconate Cloth 2 % PADS 6 each  6 each Topical Q0600 Kolluru, Sarath, MD      . dialysis solution 2.5% low-MG/low-CA dianeal solution   Intraperitoneal Q24H Kolluru, Sarath, MD      . diphenhydrAMINE (BENADRYL) injection 50 mg  50 mg Intravenous Once PRN Wynn Kernes A, PA-C      . famotidine (PEPCID) tablet 40 mg  40 mg Oral Once PRN Librado Guandique A, PA-C      . furosemide (LASIX) injection 60 mg  60 mg Intravenous BID Kolluru, Sarath, MD   60 mg at 02/07/21 0937  . gentamicin cream (GARAMYCIN) 0.1 % 1 application  1 application Topical Daily Kolluru, Sarath, MD   1 application at [id number]  . guaiFENesin-dextromethorphan (ROBITUSSIN DM) 100-10 MG/5ML syrup 5 mL  5 mL Oral Q4H PRN Athena Masse, MD   5 mL at 02/06/21 2148  . heparin injection 5,000 Units  5,000 Units Subcutaneous Q8H Agbata, Tochukwu, MD   5,000 Units at 02/07/21 [id number]  . HYDROmorphone (DILAUDID) injection 1 mg  1 mg Intravenous Once PRN Isidro Monks, Janalyn Harder, PA-C      .  insulin aspart (novoLOG) injection 0-5 Units  0-5 Units Subcutaneous QHS Wieting, Richard, MD      . insulin aspart (novoLOG) injection 0-6 Units  0-6 Units Subcutaneous TID WC Wieting, Richard, MD      . ipratropium-albuterol (DUONEB) 0.5-2.5 (3) MG/3ML nebulizer solution 3 mL  3 mL Nebulization BID [name], MD   3 mL at 02/07/21 0902  . methylPREDNISolone sodium succinate (SOLU-MEDROL) 125 mg/2 mL injection 125 mg  125 mg Intravenous Once PRN Tiyona Desouza A, PA-C      . midazolam (VERSED) 2 MG/ML syrup 8 mg  8 mg Oral Once PRN Simranjit Thayer A, PA-C      . ondansetron (ZOFRAN) tablet 4 mg  4 mg  Oral Q6H PRN Agbata, Tochukwu, MD       Or  . ondansetron (ZOFRAN) injection 4 mg  4 mg Intravenous Q6H PRN Agbata, Tochukwu, MD      . ondansetron (ZOFRAN) injection 4 mg  4 mg Intravenous Q6H PRN Aren Cherne A, PA-C      . simvastatin (ZOCOR) tablet 20 mg  20 mg Oral QHS Agbata, Tochukwu, MD   20 mg at 02/06/21 2153  . sodium chloride flush (NS) 0.9 % injection 3 mL  3 mL Intravenous Q12H [name], MD   10 mL at 02/07/21 [id number]  . sodium zirconium cyclosilicate (LOKELMA) packet 10 g  10 g Oral Daily Agbata, Tochukwu, MD   10 g at 02/07/21 [id number]   Past Medical History:  Diagnosis Date  . Anemia   . Anxiety   . CHF (congestive heart failure) ([place])   . Chronic kidney disease   . Diabetes mellitus without complication ([place])    Pre-Diabetic  . Hyperlipidemia   . Hypertension    Past Surgical History:  Procedure Laterality Date  . CAPD INSERTION N/A 03/04/2018   Procedure: LAPAROSCOPIC INSERTION CONTINUOUS AMBULATORY PERITONEAL DIALYSIS  (CAPD) CATHETER;  Surgeon: Katha Cabal, MD;  Location: [facility] ORS;  Service: Vascular;  Laterality: N/A;  . CAPD REVISION N/A 04/06/2018   Procedure: LAPAROSCOPIC REVISION CONTINUOUS AMBULATORY PERITONEAL DIALYSIS  (CAPD) CATHETER;  Surgeon: Katha Cabal, MD;  Location: [facility] ORS;  Service: Vascular;  Laterality: N/A;  . CORONARY ARTERY BYPASS GRAFT     Oct 1996  . heart bypass     Social History Social History   Tobacco Use  . Smoking status: Never Smoker  . Smokeless tobacco: Never Used  Vaping Use  . Vaping Use: Never used  Substance Use Topics  . Alcohol use: No  . Drug use: No   Family History Family History  Problem Relation Age of Onset  . Other Mother   . Hypertension Mother   Denies family history of peripheral arteries, VCs or renal disease.  Allergies  Allergen Reactions  . Hydralazine Swelling    Swelling hands feet and ankles   REVIEW OF SYSTEMS (Negative unless checked)  Constitutional: '[]'$ Weight  loss  '[]'$ Fever  '[]'$ Chills Cardiac: '[]'$ Chest pain   '[]'$ Chest pressure   '[]'$ Palpitations   '[]'$ Shortness of breath when laying flat   '[]'$ Shortness of breath at rest   '[]'$ Shortness of breath with exertion. Vascular:  '[]'$ Pain in legs with walking   '[]'$ Pain in legs at rest   '[]'$ Pain in legs when laying flat   '[]'$ Claudication   '[]'$ Pain in feet when walking  '[]'$ Pain in feet at rest  '[]'$ Pain in feet when laying flat   '[]'$ History of DVT   '[]'$ Phlebitis   '[]'$ Swelling in legs   '[]'$ Varicose  veins   '[]'$ Non-healing ulcers Pulmonary:   '[]'$ Uses home oxygen   '[]'$ Productive cough   '[]'$ Hemoptysis   '[]'$ Wheeze  '[]'$ COPD   '[]'$ Asthma Neurologic:  '[]'$ Dizziness  '[]'$ Blackouts   '[]'$ Seizures   '[]'$ History of stroke   '[]'$ History of TIA  '[]'$ Aphasia   '[]'$ Temporary blindness   '[]'$ Dysphagia   '[]'$ Weakness or numbness in arms   '[]'$ Weakness or numbness in legs Musculoskeletal:  '[x]'$ Arthritis   '[]'$ Joint swelling   '[]'$ Joint pain   '[]'$ Low back pain Hematologic:  '[]'$ Easy bruising  '[]'$ Easy bleeding   '[]'$ Hypercoagulable state   '[x]'$ Anemic  '[]'$ Hepatitis Gastrointestinal:  '[]'$ Blood in stool   '[]'$ Vomiting blood  '[]'$ Gastroesophageal reflux/heartburn   '[]'$ Difficulty swallowing. Genitourinary:  '[x]'$ Chronic kidney disease   '[]'$ Difficult urination  '[]'$ Frequent urination  '[]'$ Burning with urination   '[]'$ Blood in urine Skin:  '[]'$ Rashes   '[]'$ Ulcers   '[]'$ Wounds Psychological:  '[]'$ History of anxiety   '[]'$  History of major depression.  Physical Examination  Vitals:   02/07/21 0443 02/07/21 0744 02/07/21 0903 02/07/21 1227  BP:  131/71  125/71  Pulse:  85 79 84  Resp:  18 (!) 22 18  Temp:  97.6 F (36.4 C)  97.6 F (36.4 C)  TempSrc:  Oral  Oral  SpO2:  93% 93% 96%  Weight: 80.6 kg     Height:       Body mass index is 27.02 kg/m. Gen:  WD/WN, NAD Head: [place]/AT, No temporalis wasting. Prominent temp pulse not noted. Ear/Nose/Throat: Hearing grossly intact, nares w/o erythema or drainage, oropharynx w/o Erythema/Exudate Eyes: Sclera non-icteric, conjunctiva clear Neck: Trachea midline.  No JVD.   Pulmonary:  Good air movement, respirations not labored, equal bilaterally.  Cardiac: RRR, normal S1, S2. Vascular:  Vessel Right Left  Radial Palpable Palpable  Ulnar Palpable Palpable                               Gastrointestinal:   Soft, non-tender/non-distended. No guarding/reflex.  PD catheter: Intact.  No signs of infection. Musculoskeletal: M/S 5/5 throughout.  Extremities without ischemic changes.  No deformity or atrophy. No edema. Neurologic: Sensation grossly intact in extremities.  Symmetrical.  Speech is fluent. Motor exam as listed above. Psychiatric: Judgment intact, Mood & affect appropriate for pt's clinical situation. Dermatologic: No rashes or ulcers noted.  No cellulitis or open wounds. Lymph : No Cervical, Axillary, or Inguinal lymphadenopathy.  CBC Lab Results  Component Value Date   WBC 9.2 02/06/2021   HGB 8.6 (L) 02/06/2021   HCT 25.4 (L) 02/06/2021   MCV 100.0 02/06/2021   PLT 313 02/06/2021   BMET    Component Value Date/Time   NA 131 (L) 02/07/2021 1240   K 4.5 02/07/2021 1240   CL 96 (L) 02/07/2021 1240   CO2 26 02/07/2021 1240   GLUCOSE 136 (H) 02/07/2021 1240   BUN 34 (H) 02/07/2021 1240   CREATININE 4.66 (H) 02/07/2021 1240   CALCIUM 7.2 (L) 02/07/2021 1240   GFRNONAA 11 (L) 02/07/2021 1240   GFRAA 9 (L) 04/01/2018 1415   Estimated Creatinine Clearance: 10.4 mL/min (A) (by C-G formula based on SCr of 4.66 mg/dL (H)).  COAG Lab Results  Component Value Date   INR 1.07 04/01/2018   INR 1.05 02/25/2018   Radiology US RENAL  Result Date: 02/06/2021 CLINICAL DATA:  Inpatient. End-stage renal disease. Peritoneal dialysis. EXAM: RENAL / URINARY TRACT ULTRASOUND COMPLETE COMPARISON:  None. FINDINGS: Right Kidney: Renal measurements: 8.4 x 5.3 x 4.8  cm = volume: 110 mL. Limited visualization of the lower right kidney due to overlying bowel gas. No hydronephrosis. Thin (8 mm) echogenic renal parenchyma. Limited visualization of a  simple appearing 3.2 x 2.4 x 3.5 cm interpolar right renal cyst. Left Kidney: Renal measurements: 7.8 x 4.3 x 5.0 cm = volume: 86 mL. No hydronephrosis. Thin (8 mm) echogenic renal parenchyma. No renal mass. Bladder: Appears normal for degree of bladder distention. Other: Small volume ascites. IMPRESSION: 1. No hydronephrosis. 2. Small echogenic kidneys, compatible with reported nonspecific chronic renal parenchymal disease. 3. Simple appearing 3.5 cm interpolar right renal cyst. 4. Small volume ascites, presumably related to peritoneal dialysis. Electronically Signed   By: Ilona Sorrel M.D.   On: 02/06/2021 12:20   DG Chest Port 1 View  Result Date: 02/07/2021 CLINICAL DATA:  Cough EXAM: PORTABLE CHEST 1 VIEW COMPARISON:  02/05/2021 FINDINGS: Prior CABG. Heart is normal size. No confluent opacities or effusions. No acute bony abnormality. Aortic atherosclerosis. IMPRESSION: No active disease. Electronically Signed   By: Rolm Baptise M.D.   On: 02/07/2021 07:56   DG Chest Portable 1 View  Result Date: 02/05/2021 CLINICAL DATA:  Shortness of breath, wheezing EXAM: PORTABLE CHEST 1 VIEW COMPARISON:  None. FINDINGS: Prior CABG. Heart is normal size. Aortic atherosclerosis. No confluent opacities or effusions. No acute bony abnormality. IMPRESSION: No active disease. Electronically Signed   By: Rolm Baptise M.D.   On: 02/05/2021 11:05   DG Abd 2 Views  Result Date: 02/06/2021 CLINICAL DATA:  Peritoneal dialysis catheter malfunction EXAM: ABDOMEN - 2 VIEW COMPARISON:  12/26/2020 FINDINGS: Peritoneal dialysis catheter is located in the right lower quadrant, unchanged in position since prior study. Nonobstructive bowel gas pattern. Mild gaseous distention of the transverse colon. No organomegaly or free air. IMPRESSION: Peritoneal dialysis catheter in the right lower quadrant, unchanged in position since prior study. No acute findings. Electronically Signed   By: Rolm Baptise M.D.   On: 02/06/2021 16:50    Assessment/Plan Wesley Henderson is a 87 year old male with medical history significant for end-stage renal disease on peritoneal dialysis, diabetes mellitus, CHF and coronary artery disease who presents to the ER for evaluation of several days of generalized weakness which has progressively worsened over the last couple of days and associated with multiple falls.    1.  End-stage renal disease: Known history of end-stage renal disease currently on peritoneal dialysis however vascular surgery was consulted by Dr. Juleen China to place a PermCath as the patient is no longer tolerating peritoneal dialysis.  A PermCath will allow the patient to dialyze both in the inpatient outpatient setting.  Procedure, risks and benefits were explained to the patient.  All questions were answered.  The patient wished to proceed.  2.  Hyperlipidemia: On aspirin and statin for medical management Encouraged good control as its slows the progression of atherosclerotic disease  3.  Diabetes: Diet controlled Sliding scale while in house Encouraged good control as its slows the progression of atherosclerotic disease  Discussed with Dr. Francene Castle, PA-C  02/07/2021 1:44 PM  This note was created with Dragon medical transcription system.  Any error is purely unintentional

## 2021-02-07 NOTE — Progress Notes (Signed)
*  PRELIMINARY RESULTS* Echocardiogram 2D Echocardiogram has been performed.  Sherrie Sport 02/07/2021, 9:00 AM

## 2021-02-07 NOTE — Progress Notes (Signed)
[facility]  ROUNDING NOTE   Subjective:   Wesley Henderson was admitted to [facility] on 02/05/2021 for Hyperkalemia [E87.5] Generalized weakness [R53.1] Acute bronchitis, unspecified organism [J20.9]  Last peritoneal treatment was last night  Patient was found to have hypokalemia and then his primary nephrologist ordered potassium chloride. Since starting this medication, patient has been tired and weak. Yesterday, patient had follow up labs with a serum potassium of 6.2.   Brought to ED with a potassium of 6.4.   Patient seen while getting an ECHO States PD went well last night until the end He received 3 cycles.  Alert and oriented Wife at bedside Wife voiced concerns of patient's weakness and her burnout.  Objective:  Vital signs in last 24 hours:  Temp:  [97.6 F (36.4 C)-98.2 F (36.8 C)] 97.6 F (36.4 C) (02/18 0744) Pulse Rate:  [75-86] 79 (02/18 0903) Resp:  [16-24] 22 (02/18 0903) BP: (116-132)/(58-71) 131/71 (02/18 0744) SpO2:  [93 %-97 %] 93 % (02/18 0903) Weight:  [80.6 kg] 80.6 kg (02/18 0443)  Weight change: -1.048 kg Filed Weights   02/05/21 0929 02/05/21 1456 02/07/21 0443  Weight: 81.6 kg 80.8 kg 80.6 kg    Intake/Output: I/O last 3 completed shifts: In: [id number] [P.O.:660; Other:10800] Out: 101 [Urine:101]   Intake/Output this shift:  Total I/O In: 10 [I.V.:10] Out: -   Physical Exam: General: NAD,   Head: Normocephalic, atraumatic. Moist oral mucosal membranes  Eyes: Anicteric, PERRL  Neck: Supple, trachea midline  Lungs:  Wheezing in bilateral bases, nonproductive moist cough  Heart: Regular rate and rhythm  Abdomen:  Soft, nontender,   Extremities:  + peripheral edema.  Neurologic: Nonfocal, moving all four extremities  Skin: No lesions  Access: PD catheter    Basic Metabolic Panel: Recent Labs  Lab 02/05/21 0928 02/05/21 1509 02/06/21 0458  NA 132* 131* 131*  K 6.4* 6.1* 5.2*  CL 97* 99 97*  CO2 '26 24 26   '$ GLUCOSE 153* 61* 172*  BUN 33* 34* 35*  CREATININE 4.66* 4.72* 4.47*  CALCIUM 7.4* 7.3* 7.2*    Liver Function Tests: Recent Labs  Lab 02/05/21 1107  AST 27  ALT 18  ALKPHOS 76  BILITOT 0.7  PROT 5.0*  ALBUMIN 1.6*   No results for input(s): LIPASE, AMYLASE in the last 168 hours. No results for input(s): AMMONIA in the last 168 hours.  CBC: Recent Labs  Lab 02/05/21 0928 02/06/21 0458  WBC 14.1* 9.2  HGB 8.6* 8.6*  HCT 26.2* 25.4*  MCV 101.9* 100.0  PLT 322 313    Cardiac Enzymes: Recent Labs  Lab 02/05/21 1509  CKTOTAL 207    BNP: Invalid input(s): POCBNP  CBG: Recent Labs  Lab 02/06/21 0909 02/06/21 1146 02/06/21 1633 02/06/21 2145 02/07/21 0745  GLUCAP 120* 140* 122* 165* 117*    Microbiology: Results for orders placed or performed during the hospital encounter of 02/05/21  Resp Panel by RT-PCR (Flu A&B, Covid) Nasopharyngeal Swab     Status: None   Collection Time: 02/05/21 11:07 AM   Specimen: Nasopharyngeal Swab; Nasopharyngeal(NP) swabs in vial transport medium  Result Value Ref Range Status   SARS Coronavirus 2 by RT PCR NEGATIVE NEGATIVE Final    Comment: (NOTE) SARS-CoV-2 target nucleic acids are NOT DETECTED.  The SARS-CoV-2 RNA is generally detectable in upper respiratory specimens during the acute phase of infection. The lowest concentration of SARS-CoV-2 viral copies this assay can detect is 138 copies/mL. A negative result does  not preclude SARS-Cov-2 infection and should not be used as the sole basis for treatment or other patient management decisions. A negative result may occur with  improper specimen collection/handling, submission of specimen other than nasopharyngeal swab, presence of viral mutation(s) within the areas targeted by this assay, and inadequate number of viral copies(<138 copies/mL). A negative result must be combined with clinical observations, patient history, and epidemiological information. The expected  result is Negative.  Fact Sheet for Patients:  EntrepreneurPulse.com.au  Fact Sheet for Healthcare Providers:  IncredibleEmployment.be  This test is no t yet approved or cleared by the [place] FDA and  has been authorized for detection and/or diagnosis of SARS-CoV-2 by FDA under an Emergency Use Authorization (EUA). This EUA will remain  in effect (meaning this test can be used) for the duration of the COVID-19 declaration under Section 564(b)(1) of the Act, 21 U.S.C.section 360bbb-3(b)(1), unless the authorization is terminated  or revoked sooner.       Influenza A by PCR NEGATIVE NEGATIVE Final   Influenza B by PCR NEGATIVE NEGATIVE Final    Comment: (NOTE) The Xpert Xpress SARS-CoV-2/FLU/RSV plus assay is intended as an aid in the diagnosis of influenza from Nasopharyngeal swab specimens and should not be used as a sole basis for treatment. Nasal washings and aspirates are unacceptable for Xpert Xpress SARS-CoV-2/FLU/RSV testing.  Fact Sheet for Patients: EntrepreneurPulse.com.au  Fact Sheet for Healthcare Providers: IncredibleEmployment.be  This test is not yet approved or cleared by the [place] FDA and has been authorized for detection and/or diagnosis of SARS-CoV-2 by FDA under an Emergency Use Authorization (EUA). This EUA will remain in effect (meaning this test can be used) for the duration of the COVID-19 declaration under Section 564(b)(1) of the Act, 21 U.S.C. section 360bbb-3(b)(1), unless the authorization is terminated or revoked.  Performed at [facility], [address] [place], [place] [address]     Coagulation Studies: No results for input(s): LABPROT, INR in the last 72 hours.  Urinalysis: Recent Labs    02/05/21 1410  COLORURINE YELLOW*  LABSPEC 1.008  PHURINE 8.0  GLUCOSEU NEGATIVE  HGBUR MODERATE*  BILIRUBINUR NEGATIVE  KETONESUR NEGATIVE   PROTEINUR NEGATIVE  NITRITE NEGATIVE  LEUKOCYTESUR NEGATIVE      Imaging: US RENAL  Result Date: 02/06/2021 CLINICAL DATA:  Inpatient. End-stage renal disease. Peritoneal dialysis. EXAM: RENAL / URINARY TRACT ULTRASOUND COMPLETE COMPARISON:  None. FINDINGS: Right Kidney: Renal measurements: 8.4 x 5.3 x 4.8 cm = volume: 110 mL. Limited visualization of the lower right kidney due to overlying bowel gas. No hydronephrosis. Thin (8 mm) echogenic renal parenchyma. Limited visualization of a simple appearing 3.2 x 2.4 x 3.5 cm interpolar right renal cyst. Left Kidney: Renal measurements: 7.8 x 4.3 x 5.0 cm = volume: 86 mL. No hydronephrosis. Thin (8 mm) echogenic renal parenchyma. No renal mass. Bladder: Appears normal for degree of bladder distention. Other: Small volume ascites. IMPRESSION: 1. No hydronephrosis. 2. Small echogenic kidneys, compatible with reported nonspecific chronic renal parenchymal disease. 3. Simple appearing 3.5 cm interpolar right renal cyst. 4. Small volume ascites, presumably related to peritoneal dialysis. Electronically Signed   By: Ilona Sorrel M.D.   On: 02/06/2021 12:20   DG Chest Port 1 View  Result Date: 02/07/2021 CLINICAL DATA:  Cough EXAM: PORTABLE CHEST 1 VIEW COMPARISON:  02/05/2021 FINDINGS: Prior CABG. Heart is normal size. No confluent opacities or effusions. No acute bony abnormality. Aortic atherosclerosis. IMPRESSION: No active disease. Electronically Signed   By: Lennette Bihari  [place] M.D.   On: 02/07/2021 07:56   DG Abd 2 Views  Result Date: 02/06/2021 CLINICAL DATA:  Peritoneal dialysis catheter malfunction EXAM: ABDOMEN - 2 VIEW COMPARISON:  12/26/2020 FINDINGS: Peritoneal dialysis catheter is located in the right lower quadrant, unchanged in position since prior study. Nonobstructive bowel gas pattern. Mild gaseous distention of the transverse colon. No organomegaly or free air. IMPRESSION: Peritoneal dialysis catheter in the right lower quadrant, unchanged in  position since prior study. No acute findings. Electronically Signed   By: Rolm Baptise M.D.   On: 02/06/2021 16:50     Medications:   . dialysis solution 2.5% low-MG/low-CA     . amLODipine  5 mg Oral Daily  . aspirin EC  81 mg Oral QODAY  . carvedilol  12.5 mg Oral BID WC  . [START ON 02/08/2021] Chlorhexidine Gluconate Cloth  6 each Topical Q0600  . furosemide  60 mg Intravenous BID  . gentamicin cream  1 application Topical Daily  . heparin  5,000 Units Subcutaneous Q8H  . insulin aspart  0-5 Units Subcutaneous QHS  . insulin aspart  0-6 Units Subcutaneous TID WC  . ipratropium-albuterol  3 mL Nebulization BID  . simvastatin  20 mg Oral QHS  . sodium chloride flush  3 mL Intravenous Q12H  . sodium zirconium cyclosilicate  10 g Oral Daily   guaiFENesin-dextromethorphan, ondansetron **OR** ondansetron (ZOFRAN) IV  Assessment/ Plan:  Wesley Henderson is a 87 y.o. white male with end stage renal disease on peritoneal dialysis, hypertension, diabetes mellitus type II, hyperlipidemia, congestive heart failure who is admitted to [facility] on 02/05/2021 for Hyperkalemia [E87.5] Generalized weakness [R53.1] Acute bronchitis, unspecified organism [J20.9]  [facility] [place] 80kg Peritoneal Dialysis CCPD 8 hours 4 exchanges 2741m fills   1. End stage renal disease: with hyperkalemia -Due to concerns of ineffective PD treatments, will place patient on hemodialysis for proper treatment -will speak with vascular about HD cath placement. - Hemodialysis today, once temp cath placed.  -Lokelma daily - Furosemide '60mg'$  IV BID - hold telmisartan -concerned about sudden decline from January to now. -Renal [place]-small right cyst -Peritoneal dialysis cath placement verified with x-ray  2. Hypertension: 104/62 - hypotensive. Home regimen of amlodipine, carvedilol, furosemide, and telmisartan -Improved BP on Amlodipine, Furosemide and Carvedilol  3. Anemia of chronic kidney disease:  -  hemoglobin 8.6 and macrocytic - EPO and venofer as outpatient -Iron-24 -Ferritin - 1047  4. Secondary Hyperparathyroidism: with hypocalcemia. Corrected calcium of 9.2. Not currently on binders.   5. Nutrition: albumin of 1.6 - cause for patient's hypotension and peripheral edema.  -Albumin given yesterday   LOS: 1 Shantelle Breeze 2/18/202212:08 PM

## 2021-02-07 NOTE — Progress Notes (Signed)
Patient ID: Wesley Henderson, male   DOB: 01/04/1930, 87 y.o.   MRN: [id number] [facility] PROGRESS NOTE  Wesley Henderson [id number] DOB: Jun 25, 1930 DOA: 02/05/2021 PCP: [name], MD  HPI/Subjective: Patient feels okay.  States his breathing is fine despite audible wheeze.  Patient has some cough.  Admitted with hyperkalemia but also had acute CHF.  Objective: Vitals:   02/07/21 1227 02/07/21 1419  BP: 125/71 122/66  Pulse: 84 86  Resp: 18 18  Temp: 97.6 F (36.4 C)   SpO2: 96% 97%    Intake/Output Summary (Last 24 hours) at 02/07/2021 1455 Last data filed at 02/07/2021 1331 Gross per 24 hour  Intake 11170 ml  Output 650 ml  Net 10520 ml   Filed Weights   02/05/21 0929 02/05/21 1456 02/07/21 0443  Weight: 81.6 kg 80.8 kg 80.6 kg    ROS: Review of Systems  Respiratory: Positive for cough, shortness of breath and wheezing.   Cardiovascular: Negative for chest pain.  Gastrointestinal: Negative for abdominal pain, nausea and vomiting.   Exam: Physical Exam HENT:     Head: Normocephalic.     Mouth/Throat:     Pharynx: No oropharyngeal exudate.  Eyes:     General: Lids are normal.     Conjunctiva/sclera: Conjunctivae normal.  Cardiovascular:     Rate and Rhythm: Normal rate and regular rhythm.     Heart sounds: Normal heart sounds, S1 normal and S2 normal.  Pulmonary:     Breath sounds: Transmitted upper airway sounds present. Examination of the right-middle field reveals decreased breath sounds and wheezing. Examination of the left-middle field reveals decreased breath sounds and wheezing. Examination of the right-lower field reveals decreased breath sounds and rhonchi. Examination of the left-lower field reveals decreased breath sounds and rhonchi. Decreased breath sounds, wheezing and rhonchi present. No rales.  Abdominal:     Palpations: Abdomen is soft.     Tenderness: There is no abdominal tenderness.  Musculoskeletal:     Right ankle: Swelling  present.     Left ankle: Swelling present.  Skin:    General: Skin is warm.     Findings: No rash.  Neurological:     Mental Status: He is alert.     Comments: Answers all yes or no questions appropriately.       Data Reviewed: Basic Metabolic Panel: Recent Labs  Lab 02/05/21 0928 02/05/21 1509 02/06/21 0458 02/07/21 1240  NA 132* 131* 131* 131*  K 6.4* 6.1* 5.2* 4.5  CL 97* 99 97* 96*  CO2 '26 24 26 26  '$ GLUCOSE 153* 61* 172* 136*  BUN 33* 34* 35* 34*  CREATININE 4.66* 4.72* 4.47* 4.66*  CALCIUM 7.4* 7.3* 7.2* 7.2*  PHOS  --   --   --  4.1  4.2   Liver Function Tests: Recent Labs  Lab 02/05/21 1107 02/07/21 1240  AST 27  --   ALT 18  --   ALKPHOS 76  --   BILITOT 0.7  --   PROT 5.0*  --   ALBUMIN 1.6* 1.8*   CBC: Recent Labs  Lab 02/05/21 0928 02/06/21 0458  WBC 14.1* 9.2  HGB 8.6* 8.6*  HCT 26.2* 25.4*  MCV 101.9* 100.0  PLT 322 313   Cardiac Enzymes: Recent Labs  Lab 02/05/21 1509  CKTOTAL 207   BNP (last 3 results) Recent Labs    02/05/21 1345  BNP 368.6*     CBG: Recent Labs  Lab 02/06/21 1633 02/06/21 2145 02/07/21  0745 02/07/21 1227 02/07/21 1422  GLUCAP 122* 165* 117* 127* 109*    Recent Results (from the past 240 hour(s))  Resp Panel by RT-PCR (Flu A&B, Covid) Nasopharyngeal Swab     Status: None   Collection Time: 02/05/21 11:07 AM   Specimen: Nasopharyngeal Swab; Nasopharyngeal(NP) swabs in vial transport medium  Result Value Ref Range Status   SARS Coronavirus 2 by RT PCR NEGATIVE NEGATIVE Final    Comment: (NOTE) SARS-CoV-2 target nucleic acids are NOT DETECTED.  The SARS-CoV-2 RNA is generally detectable in upper respiratory specimens during the acute phase of infection. The lowest concentration of SARS-CoV-2 viral copies this assay can detect is 138 copies/mL. A negative result does not preclude SARS-Cov-2 infection and should not be used as the sole basis for treatment or other patient management decisions. A  negative result may occur with  improper specimen collection/handling, submission of specimen other than nasopharyngeal swab, presence of viral mutation(s) within the areas targeted by this assay, and inadequate number of viral copies(<138 copies/mL). A negative result must be combined with clinical observations, patient history, and epidemiological information. The expected result is Negative.  Fact Sheet for Patients:  EntrepreneurPulse.com.au  Fact Sheet for Healthcare Providers:  IncredibleEmployment.be  This test is no t yet approved or cleared by the [place] FDA and  has been authorized for detection and/or diagnosis of SARS-CoV-2 by FDA under an Emergency Use Authorization (EUA). This EUA will remain  in effect (meaning this test can be used) for the duration of the COVID-19 declaration under Section 564(b)(1) of the Act, 21 U.S.C.section 360bbb-3(b)(1), unless the authorization is terminated  or revoked sooner.       Influenza A by PCR NEGATIVE NEGATIVE Final   Influenza B by PCR NEGATIVE NEGATIVE Final    Comment: (NOTE) The Xpert Xpress SARS-CoV-2/FLU/RSV plus assay is intended as an aid in the diagnosis of influenza from Nasopharyngeal swab specimens and should not be used as a sole basis for treatment. Nasal washings and aspirates are unacceptable for Xpert Xpress SARS-CoV-2/FLU/RSV testing.  Fact Sheet for Patients: EntrepreneurPulse.com.au  Fact Sheet for Healthcare Providers: IncredibleEmployment.be  This test is not yet approved or cleared by the [place] FDA and has been authorized for detection and/or diagnosis of SARS-CoV-2 by FDA under an Emergency Use Authorization (EUA). This EUA will remain in effect (meaning this test can be used) for the duration of the COVID-19 declaration under Section 564(b)(1) of the Act, 21 U.S.C. section 360bbb-3(b)(1), unless the authorization  is terminated or revoked.  Performed at [facility], [address], [place], [place] [address]      Studies: US RENAL  Result Date: 02/06/2021 CLINICAL DATA:  Inpatient. End-stage renal disease. Peritoneal dialysis. EXAM: RENAL / URINARY TRACT ULTRASOUND COMPLETE COMPARISON:  None. FINDINGS: Right Kidney: Renal measurements: 8.4 x 5.3 x 4.8 cm = volume: 110 mL. Limited visualization of the lower right kidney due to overlying bowel gas. No hydronephrosis. Thin (8 mm) echogenic renal parenchyma. Limited visualization of a simple appearing 3.2 x 2.4 x 3.5 cm interpolar right renal cyst. Left Kidney: Renal measurements: 7.8 x 4.3 x 5.0 cm = volume: 86 mL. No hydronephrosis. Thin (8 mm) echogenic renal parenchyma. No renal mass. Bladder: Appears normal for degree of bladder distention. Other: Small volume ascites. IMPRESSION: 1. No hydronephrosis. 2. Small echogenic kidneys, compatible with reported nonspecific chronic renal parenchymal disease. 3. Simple appearing 3.5 cm interpolar right renal cyst. 4. Small volume ascites, presumably related to peritoneal dialysis. Electronically  Signed   By: Ilona Sorrel M.D.   On: 02/06/2021 12:20   DG Chest Port 1 View  Result Date: 02/07/2021 CLINICAL DATA:  Cough EXAM: PORTABLE CHEST 1 VIEW COMPARISON:  02/05/2021 FINDINGS: Prior CABG. Heart is normal size. No confluent opacities or effusions. No acute bony abnormality. Aortic atherosclerosis. IMPRESSION: No active disease. Electronically Signed   By: Rolm Baptise M.D.   On: 02/07/2021 07:56   DG Abd 2 Views  Result Date: 02/06/2021 CLINICAL DATA:  Peritoneal dialysis catheter malfunction EXAM: ABDOMEN - 2 VIEW COMPARISON:  12/26/2020 FINDINGS: Peritoneal dialysis catheter is located in the right lower quadrant, unchanged in position since prior study. Nonobstructive bowel gas pattern. Mild gaseous distention of the transverse colon. No organomegaly or free air. IMPRESSION: Peritoneal dialysis  catheter in the right lower quadrant, unchanged in position since prior study. No acute findings. Electronically Signed   By: Rolm Baptise M.D.   On: 02/06/2021 16:50    Scheduled Meds: . fentaNYL      . midazolam      . [MAR Hold] amLODipine  5 mg Oral Daily  . [MAR Hold] aspirin EC  81 mg Oral QODAY  . [MAR Hold] carvedilol  12.5 mg Oral BID WC  . [MAR Hold] Chlorhexidine Gluconate Cloth  6 each Topical Q0600  . [MAR Hold] furosemide  60 mg Intravenous BID  . [MAR Hold] gentamicin cream  1 application Topical Daily  . [MAR Hold] heparin  5,000 Units Subcutaneous Q8H  . [MAR Hold] insulin aspart  0-5 Units Subcutaneous QHS  . [MAR Hold] insulin aspart  0-6 Units Subcutaneous TID WC  . [MAR Hold] ipratropium-albuterol  3 mL Nebulization BID  . [MAR Hold] simvastatin  20 mg Oral QHS  . [MAR Hold] sodium chloride flush  3 mL Intravenous Q12H  . [MAR Hold] sodium zirconium cyclosilicate  10 g Oral Daily   Continuous Infusions: . sodium chloride    . [START ON 02/08/2021]  ceFAZolin (ANCEF) IV      Assessment/Plan:  1. Acute CHF.  Echocardiogram still pending.  Patient will be converted over to hemodialysis.  Patient on IV Lasix but does not urinate much.  Currently on Coreg. 2. Wheeze.  Likely cardiac wheeze.  I did order nebulizer treatments.  Still having some wheezing despite seeing after nebulizer today. 3. End-stage renal disease with hyperkalemia.  Today's potassium better at 4.5 with [facility] and peritoneal dialysis.  Patient will be converted over to hemodialysis in order to handle fluid better.  Holding telmisartan with hyperkalemia 4. Weakness and falls.  Appreciate physical therapy evaluation recommending home health PT. 5. Essential hypertension.  Continue Norvasc, Coreg and IV Lasix.  Holding telmisartan with hyperkalemia 6. Hyperlipidemia.  Continue simvastatin. 7. Type 2 diabetes mellitus with end-stage renal disease.  Hemoglobin A1c actually low at 6.0.  Continue to  monitor 8. Renal cyst on sonogram 9. Anemia of chronic disease.  Last hemoglobin 8.6. 10. Secondary hyperparathyroidism 11. Albumin low at 1.6 likely cause of edema.  Albumin given yesterday by nephrology        Code Status:     Code Status Orders  (From admission, onward)         Start     Ordered   02/05/21 1422  Full code  Continuous        02/05/21 1423        Code Status History    This patient has a current code status but no historical code status.  Advance Care Planning Activity     Family Communication: Spoke with family at the bedside Disposition Plan: Status is: Inpatient  Dispo: The patient is from: Home              Anticipated d/c is to: Home              Anticipated d/c date is: 3 or 4 days.              Patient currently patient converting from peritoneal dialysis to hemodialysis.  Patient will need 3 hemodialysis sessions as inpatient and an outpatient dialysis slot prior to disposition   Difficult to place patient.  No  Consultants:  Nephrology  Vascular surgery  Procedures:  PermCath today  Time spent: 28 minutes  [place]

## 2021-02-08 DIAGNOSIS — I5031 Acute diastolic (congestive) heart failure: Secondary | ICD-10-CM

## 2021-02-08 LAB — BASIC METABOLIC PANEL
Anion gap: 11 (ref 5–15)
BUN: 39 mg/dL — ABNORMAL HIGH (ref 8–23)
CO2: 25 mmol/L (ref 22–32)
Calcium: 6.9 mg/dL — ABNORMAL LOW (ref 8.9–10.3)
Chloride: 96 mmol/L — ABNORMAL LOW (ref 98–111)
Creatinine, Ser: 5 mg/dL — ABNORMAL HIGH (ref 0.61–1.24)
GFR, Estimated: 10 mL/min — ABNORMAL LOW (ref >60)
Glucose, Bld: 114 mg/dL — ABNORMAL HIGH (ref 70–99)
Potassium: 4.1 mmol/L (ref 3.5–5.1)
Sodium: 132 mmol/L — ABNORMAL LOW (ref 135–145)

## 2021-02-08 LAB — GLUCOSE, CAPILLARY: Glucose-Capillary: 117 mg/dL — ABNORMAL HIGH (ref 70–99)

## 2021-02-08 LAB — ECHOCARDIOGRAM COMPLETE
AR max vel: 1.4 cm2
AV Area VTI: 1.17 cm2
AV Area mean vel: 1.19 cm2
AV Mean grad: 3.3 mmHg
AV Peak grad: 5.8 mmHg
Ao pk vel: 1.2 m/s
Area-P 1/2: 5.88 cm2
Height: 68 in
S' Lateral: 3.23 cm
Weight: 2843.05 oz

## 2021-02-08 LAB — HEPATITIS B SURFACE ANTIBODY, QUANTITATIVE: Hep B S AB Quant (Post): <3.1 m[IU]/mL — ABNORMAL LOW (ref >9.9)

## 2021-02-08 MED ORDER — BENZONATATE 100 MG PO CAPS: 100.0000 mg | ORAL_CAPSULE | Freq: Three times a day (TID) | ORAL | Status: DC | PRN

## 2021-02-08 MED ORDER — HEPARIN SODIUM (PORCINE) 1000 UNIT/ML IJ SOLN: 1700.0000 mL | INTRAMUSCULAR | Status: DC | PRN

## 2021-02-08 MED ORDER — HEPARIN SODIUM (PORCINE) 1000 UNIT/ML IJ SOLN
1.6000 mL | INTRAMUSCULAR | Status: DC | PRN
  Administered 2021-02-08: 1600 [IU]
  Filled 2021-02-08: qty 1.6

## 2021-02-08 MED ORDER — HEPARIN SODIUM (PORCINE) 1000 UNIT/ML IJ SOLN: 1600.0000 mL | INTRAMUSCULAR | Status: DC | PRN

## 2021-02-08 MED ORDER — HEPARIN SODIUM (PORCINE) 1000 UNIT/ML IJ SOLN
1.7000 mL | INTRAMUSCULAR | Status: DC | PRN
  Administered 2021-02-08: 1700 [IU]
  Filled 2021-02-08: qty 1.7

## 2021-02-08 MED ORDER — VANCOMYCIN HCL 1000 MG/200ML IV SOLN
1000.0000 mg | Freq: Once | INTRAVENOUS | Status: AC
  Administered 2021-02-08: 1000 mg via INTRAVENOUS
  Filled 2021-02-08: qty 200

## 2021-02-08 MED ORDER — VANCOMYCIN HCL 1000 MG IV SOLR
1000.0000 mg | Freq: Once | INTRAVENOUS | Status: DC
  Filled 2021-02-08: qty 1000

## 2021-02-08 NOTE — Progress Notes (Signed)
Patient ID: Wesley Henderson, male   DOB: 09/17/1930, 87 y.o.   MRN: [id number] [facility] PROGRESS NOTE  Wesley Henderson [id number] DOB: [birth year] DOA: 02/05/2021 PCP: [name], MD  HPI/Subjective: Patient still has some cough and wheeze.  He states he feels like his breathing is better.  Admitted initially with hyperkalemia.  Objective: Vitals:   02/08/21 1645 02/08/21 1650  BP: 122/64 124/66  Pulse: (!) 52 73  Resp: (!) 23 (!) 21  Temp:    SpO2: 95% 97%    Intake/Output Summary (Last 24 hours) at 02/08/2021 1757 Last data filed at 02/08/2021 1650 Gross per 24 hour  Intake 413.33 ml  Output 300 ml  Net 113.33 ml   Filed Weights   02/05/21 1456 02/07/21 0443 02/08/21 0200  Weight: 80.8 kg 80.6 kg 80.3 kg    ROS: Review of Systems  Respiratory: Positive for cough and wheezing. Negative for shortness of breath.   Cardiovascular: Negative for chest pain.  Gastrointestinal: Negative for abdominal pain, nausea and vomiting.   Exam: Physical Exam HENT:     Head: Normocephalic.     Mouth/Throat:     Pharynx: No oropharyngeal exudate.  Eyes:     General: Lids are normal.     Conjunctiva/sclera: Conjunctivae normal.  Cardiovascular:     Rate and Rhythm: Normal rate and regular rhythm.     Heart sounds: Normal heart sounds, S1 normal and S2 normal.  Pulmonary:     Breath sounds: Examination of the right-middle field reveals decreased breath sounds and wheezing. Examination of the left-middle field reveals decreased breath sounds and wheezing. Examination of the right-lower field reveals decreased breath sounds and rales. Examination of the left-lower field reveals decreased breath sounds and rales. Decreased breath sounds, wheezing and rales present. No rhonchi.  Abdominal:     Palpations: Abdomen is soft.     Tenderness: There is no abdominal tenderness.  Musculoskeletal:     Right lower leg: Swelling present.     Left lower leg: Swelling present.   Skin:    General: Skin is warm.     Findings: No rash.  Neurological:     Mental Status: He is alert and oriented to person, place, and time.       Data Reviewed: Basic Metabolic Panel: Recent Labs  Lab 02/05/21 0928 02/05/21 1509 02/06/21 0458 02/07/21 1240 02/08/21 0531  NA 132* 131* 131* 131* 132*  K 6.4* 6.1* 5.2* 4.5 4.1  CL 97* 99 97* 96* 96*  CO2 '26 24 26 26 25  '$ GLUCOSE 153* 61* 172* 136* 114*  BUN 33* 34* 35* 34* 39*  CREATININE 4.66* 4.72* 4.47* 4.66* 5.00*  CALCIUM 7.4* 7.3* 7.2* 7.2* 6.9*  PHOS  --   --   --  4.1  4.2  --    Liver Function Tests: Recent Labs  Lab 02/05/21 1107 02/07/21 1240  AST 27  --   ALT 18  --   ALKPHOS 76  --   BILITOT 0.7  --   PROT 5.0*  --   ALBUMIN 1.6* 1.8*   CBC: Recent Labs  Lab 02/05/21 0928 02/06/21 0458  WBC 14.1* 9.2  HGB 8.6* 8.6*  HCT 26.2* 25.4*  MCV 101.9* 100.0  PLT 322 313   Cardiac Enzymes: Recent Labs  Lab 02/05/21 1509  CKTOTAL 207   BNP (last 3 results) Recent Labs    02/05/21 1345  BNP 368.6*      CBG: Recent Labs  Lab 02/07/21  1227 02/07/21 1422 02/07/21 1645 02/07/21 2009 02/08/21 0725  GLUCAP 127* 109* 119* 143* 117*    Recent Results (from the past 240 hour(s))  Resp Panel by RT-PCR (Flu A&B, Covid) Nasopharyngeal Swab     Status: None   Collection Time: 02/05/21 11:07 AM   Specimen: Nasopharyngeal Swab; Nasopharyngeal(NP) swabs in vial transport medium  Result Value Ref Range Status   SARS Coronavirus 2 by RT PCR NEGATIVE NEGATIVE Final    Comment: (NOTE) SARS-CoV-2 target nucleic acids are NOT DETECTED.  The SARS-CoV-2 RNA is generally detectable in upper respiratory specimens during the acute phase of infection. The lowest concentration of SARS-CoV-2 viral copies this assay can detect is 138 copies/mL. A negative result does not preclude SARS-Cov-2 infection and should not be used as the sole basis for treatment or other patient management decisions. A negative  result may occur with  improper specimen collection/handling, submission of specimen other than nasopharyngeal swab, presence of viral mutation(s) within the areas targeted by this assay, and inadequate number of viral copies(<138 copies/mL). A negative result must be combined with clinical observations, patient history, and epidemiological information. The expected result is Negative.  Fact Sheet for Patients:  EntrepreneurPulse.com.au  Fact Sheet for Healthcare Providers:  IncredibleEmployment.be  This test is no t yet approved or cleared by the [place] FDA and  has been authorized for detection and/or diagnosis of SARS-CoV-2 by FDA under an Emergency Use Authorization (EUA). This EUA will remain  in effect (meaning this test can be used) for the duration of the COVID-19 declaration under Section 564(b)(1) of the Act, 21 U.S.C.section 360bbb-3(b)(1), unless the authorization is terminated  or revoked sooner.       Influenza A by PCR NEGATIVE NEGATIVE Final   Influenza B by PCR NEGATIVE NEGATIVE Final    Comment: (NOTE) The Xpert Xpress SARS-CoV-2/FLU/RSV plus assay is intended as an aid in the diagnosis of influenza from Nasopharyngeal swab specimens and should not be used as a sole basis for treatment. Nasal washings and aspirates are unacceptable for Xpert Xpress SARS-CoV-2/FLU/RSV testing.  Fact Sheet for Patients: EntrepreneurPulse.com.au  Fact Sheet for Healthcare Providers: IncredibleEmployment.be  This test is not yet approved or cleared by the [place] FDA and has been authorized for detection and/or diagnosis of SARS-CoV-2 by FDA under an Emergency Use Authorization (EUA). This EUA will remain in effect (meaning this test can be used) for the duration of the COVID-19 declaration under Section 564(b)(1) of the Act, 21 U.S.C. section 360bbb-3(b)(1), unless the authorization is  terminated or revoked.  Performed at [facility], [address] [place], [place] [address]      Studies: PERIPHERAL VASCULAR CATHETERIZATION  Result Date: 02/07/2021 See op note  DG Chest Port 1 View  Result Date: 02/07/2021 CLINICAL DATA:  Cough EXAM: PORTABLE CHEST 1 VIEW COMPARISON:  02/05/2021 FINDINGS: Prior CABG. Heart is normal size. No confluent opacities or effusions. No acute bony abnormality. Aortic atherosclerosis. IMPRESSION: No active disease. Electronically Signed   By: Rolm Baptise M.D.   On: 02/07/2021 07:56   ECHOCARDIOGRAM COMPLETE  Result Date: 02/08/2021    ECHOCARDIOGRAM REPORT   Patient Name:   Wesley Henderson Date of Exam: 02/07/2021 Medical Rec #:  [id number]         Height:       68.0 in Accession #:    [id number]        Weight:       177.7 lb Date of Birth:  01/01/1930  BSA:          1.944 m Patient Age:    77 years          BP:           132/58 mmHg Patient Gender: M                 HR:           75 bpm. Exam Location:  [facility] Procedure: 2D Echo, Cardiac Doppler and Color Doppler Indications:     CHF-acute diastolic [id number]  History:         Patient has no prior history of Echocardiogram examinations.                  Risk Factors:Hypertension and Diabetes.  Sonographer:     Sherrie Sport RDCS (AE) Referring Phys:  [id number] [name] Diagnosing Phys: Serafina Royals MD  Sonographer Comments: Suboptimal apical window. IMPRESSIONS  1. Left ventricular ejection fraction, by estimation, is 60 to 65%. The left ventricle has normal function. The left ventricle has no regional wall motion abnormalities. Left ventricular diastolic parameters were normal.  2. Right ventricular systolic function is normal. The right ventricular size is normal.  3. The mitral valve is normal in structure. Trivial mitral valve regurgitation.  4. The aortic valve is normal in structure. Aortic valve regurgitation is not visualized. FINDINGS  Left Ventricle: Left ventricular ejection  fraction, by estimation, is 60 to 65%. The left ventricle has normal function. The left ventricle has no regional wall motion abnormalities. The left ventricular internal cavity size was normal in size. There is  no left ventricular hypertrophy. Left ventricular diastolic parameters were normal. Right Ventricle: The right ventricular size is normal. No increase in right ventricular wall thickness. Right ventricular systolic function is normal. Left Atrium: Left atrial size was normal in size. Right Atrium: Right atrial size was normal in size. Pericardium: There is no evidence of pericardial effusion. Mitral Valve: The mitral valve is normal in structure. Trivial mitral valve regurgitation. Tricuspid Valve: The tricuspid valve is normal in structure. Tricuspid valve regurgitation is trivial. Aortic Valve: The aortic valve is normal in structure. Aortic valve regurgitation is not visualized. Aortic valve mean gradient measures 3.3 mmHg. Aortic valve peak gradient measures 5.8 mmHg. Aortic valve area, by VTI measures 1.17 cm. Pulmonic Valve: The pulmonic valve was normal in structure. Pulmonic valve regurgitation is not visualized. Aorta: The aortic root and ascending aorta are structurally normal, with no evidence of dilitation. IAS/Shunts: No atrial level shunt detected by color flow Doppler.  LEFT VENTRICLE PLAX 2D LVIDd:         4.44 cm  Diastology LVIDs:         3.23 cm  LV e' medial:    15.20 cm/s LV PW:         0.95 cm  LV E/e' medial:  4.3 LV IVS:        1.19 cm  LV e' lateral:   8.70 cm/s LVOT diam:     2.00 cm  LV E/e' lateral: 7.6 LV SV:         30 LV SV Index:   15 LVOT Area:     3.14 cm  RIGHT VENTRICLE RV Basal diam:  3.21 cm RV S prime:     13.10 cm/s TAPSE (M-mode): 3.3 cm LEFT ATRIUM             Index       RIGHT ATRIUM  Index LA diam:        4.10 cm 2.11 cm/m  RA Area:     32.60 cm LA Vol (A2C):   50.3 ml 25.88 ml/m RA Volume:   124.00 ml 63.80 ml/m LA Vol (A4C):   91.6 ml 47.13 ml/m  LA Biplane Vol: 73.8 ml 37.97 ml/m  AORTIC VALVE                   PULMONIC VALVE AV Area (Vmax):    1.40 cm    PV Vmax:        0.65 m/s AV Area (Vmean):   1.19 cm    PV Peak grad:   1.7 mmHg AV Area (VTI):     1.17 cm    RVOT Peak grad: 2 mmHg AV Vmax:           120.33 cm/s AV Vmean:          87.067 cm/s AV VTI:            0.254 m AV Peak Grad:      5.8 mmHg AV Mean Grad:      3.3 mmHg LVOT Vmax:         53.50 cm/s LVOT Vmean:        32.900 cm/s LVOT VTI:          0.095 m LVOT/AV VTI ratio: 0.37  AORTA Ao Root diam: 3.20 cm MITRAL VALVE                TRICUSPID VALVE MV Area (PHT): 5.88 cm     TR Peak grad:   21.0 mmHg MV Decel Time: 129 msec     TR Vmax:        229.00 cm/s MV E velocity: 66.00 cm/s MV A velocity: 115.00 cm/s  SHUNTS MV E/A ratio:  0.57         Systemic VTI:  0.09 m                             Systemic Diam: 2.00 cm Serafina Royals MD Electronically signed by Serafina Royals MD Signature Date/Time: 02/08/2021/7:57:03 AM    Final     Scheduled Meds: . aspirin EC  81 mg Oral QODAY  . carvedilol  12.5 mg Oral BID WC  . Chlorhexidine Gluconate Cloth  6 each Topical Q0600  . furosemide  60 mg Intravenous BID  . heparin  5,000 Units Subcutaneous Q8H  . ipratropium-albuterol  3 mL Nebulization BID  . simvastatin  20 mg Oral QHS  . sodium chloride flush  3 mL Intravenous Q12H    Assessment/Plan:  1. Acute diastolic congestive heart failure.  Patient converted to hemodialysis and had first hemodialysis session today.  On IV Lasix.  On Coreg. 2. Wheeze.  Likely cardiac wheeze.  Continue nebulizer treatments. 3. End-stage renal disease with hyperkalemia.  Today's potassium better discontinue Lokelma.  Patient had first hemodialysis session today.  Continue to hold some losartan. 4. Weakness and falls.  Appreciate physical therapy consultation. 5. Essential hypertension on Coreg.  Norvasc discontinued. 6. Hyperlipidemia on simvastatin 7. Type 2 diabetes mellitus with end-stage renal  disease.  Hemoglobin A1c actually low 6.0. 8. Renal cyst on sonogram 9. Anemia of chronic disease.  Last hemoglobin 8.6. 10. Secondary hyperparathyroidism 11. Low albumin        Code Status:     Code Status Orders  (From admission, onward)  Start     Ordered   02/05/21 1422  Full code  Continuous        02/05/21 1423        Code Status History    This patient has a current code status but no historical code status.   Advance Care Planning Activity     Family Communication: Family at bedside Disposition Plan: Status is: Inpatient  Dispo: The patient is from: Home              Anticipated d/c is to: Home              Anticipated d/c date is: We will need 2 more dialysis sessions plus an outpatient dialysis slot prior to disposition.              Patient currently not medically stable for disposition   Difficult to place patient.  No  Time spent: 28 minutes  [place]

## 2021-02-08 NOTE — Progress Notes (Signed)
Pt arrived at dialysis suite at about 1415 with no end caps on his Catheter. Catheter was placed yesterday. Notified the Floor Nurse and Dr. Candiss Norse. Dr. Candiss Norse will order 1Gram Vancomycin  As a precaution

## 2021-02-08 NOTE — Progress Notes (Signed)
[facility]  ROUNDING NOTE   Subjective:   Wesley Henderson was admitted to [facility] on 02/05/2021 for Hyperkalemia [E87.5] Generalized weakness [R53.1] Acute bronchitis, unspecified organism [J20.9]  Last peritoneal treatment was last night  Patient was found to have hypokalemia and then his primary nephrologist ordered potassium chloride. Since starting this medication, patient has been tired and weak. Yesterday, patient had follow up labs with a serum potassium of 6.2.   Brought to ED with a potassium of 6.4.   Patient seen while resting in bed Wife at bedside Alert and oriented Continues to have cough Poor appetite according to wife Denies shortness of breath or chest pain   Objective:  Vital signs in last 24 hours:  Temp:  [97.4 F (36.3 C)-98.3 F (36.8 C)] 97.6 F (36.4 C) (02/19 1123) Pulse Rate:  [46-83] 67 (02/19 1123) Resp:  [16-24] 18 (02/19 1123) BP: (103-129)/(50-77) 103/63 (02/19 1123) SpO2:  [93 %-100 %] 95 % (02/19 1123) Weight:  [80.3 kg] 80.3 kg (02/19 0200)  Weight change: -0.313 kg Filed Weights   02/05/21 1456 02/07/21 0443 02/08/21 0200  Weight: 80.8 kg 80.6 kg 80.3 kg    Intake/Output: I/O last 3 completed shifts: In: 10 [I.V.:10] Out: 950 [Urine:950]   Intake/Output this shift:  Total I/O In: 360 [P.O.:360] Out: 0   Physical Exam: General: NAD,   Head: Normocephalic, atraumatic. Moist oral mucosal membranes  Eyes: Anicteric,   Neck: Supple, trachea midline  Lungs:  Wheezing in bilateral bases, nonproductive moist cough  Heart: Regular rate and rhythm  Abdomen:  Soft, nontender,   Extremities:  + peripheral edema.  Neurologic: Nonfocal, moving all four extremities  Skin: No lesions  Access: PD catheter, Right IJ permcath    Basic Metabolic Panel: Recent Labs  Lab 02/05/21 0928 02/05/21 1509 02/06/21 0458 02/07/21 1240 02/08/21 0531  NA 132* 131* 131* 131* 132*  K 6.4* 6.1* 5.2* 4.5 4.1  CL 97* 99 97* 96*  96*  CO2 '26 24 26 26 25  '$ GLUCOSE 153* 61* 172* 136* 114*  BUN 33* 34* 35* 34* 39*  CREATININE 4.66* 4.72* 4.47* 4.66* 5.00*  CALCIUM 7.4* 7.3* 7.2* 7.2* 6.9*  PHOS  --   --   --  4.1  4.2  --     Liver Function Tests: Recent Labs  Lab 02/05/21 1107 02/07/21 1240  AST 27  --   ALT 18  --   ALKPHOS 76  --   BILITOT 0.7  --   PROT 5.0*  --   ALBUMIN 1.6* 1.8*   No results for input(s): LIPASE, AMYLASE in the last 168 hours. No results for input(s): AMMONIA in the last 168 hours.  CBC: Recent Labs  Lab 02/05/21 0928 02/06/21 0458  WBC 14.1* 9.2  HGB 8.6* 8.6*  HCT 26.2* 25.4*  MCV 101.9* 100.0  PLT 322 313    Cardiac Enzymes: Recent Labs  Lab 02/05/21 1509  CKTOTAL 207    BNP: Invalid input(s): POCBNP  CBG: Recent Labs  Lab 02/07/21 1227 02/07/21 1422 02/07/21 1645 02/07/21 2009 02/08/21 0725  GLUCAP 127* 109* 119* 143* 117*    Microbiology: Results for orders placed or performed during the hospital encounter of 02/05/21  Resp Panel by RT-PCR (Flu A&B, Covid) Nasopharyngeal Swab     Status: None   Collection Time: 02/05/21 11:07 AM   Specimen: Nasopharyngeal Swab; Nasopharyngeal(NP) swabs in vial transport medium  Result Value Ref Range Status   SARS Coronavirus 2 by RT  PCR NEGATIVE NEGATIVE Final    Comment: (NOTE) SARS-CoV-2 target nucleic acids are NOT DETECTED.  The SARS-CoV-2 RNA is generally detectable in upper respiratory specimens during the acute phase of infection. The lowest concentration of SARS-CoV-2 viral copies this assay can detect is 138 copies/mL. A negative result does not preclude SARS-Cov-2 infection and should not be used as the sole basis for treatment or other patient management decisions. A negative result may occur with  improper specimen collection/handling, submission of specimen other than nasopharyngeal swab, presence of viral mutation(s) within the areas targeted by this assay, and inadequate number of  viral copies(<138 copies/mL). A negative result must be combined with clinical observations, patient history, and epidemiological information. The expected result is Negative.  Fact Sheet for Patients:  EntrepreneurPulse.com.au  Fact Sheet for Healthcare Providers:  IncredibleEmployment.be  This test is no t yet approved or cleared by the [place] FDA and  has been authorized for detection and/or diagnosis of SARS-CoV-2 by FDA under an Emergency Use Authorization (EUA). This EUA will remain  in effect (meaning this test can be used) for the duration of the COVID-19 declaration under Section 564(b)(1) of the Act, 21 U.S.C.section 360bbb-3(b)(1), unless the authorization is terminated  or revoked sooner.       Influenza A by PCR NEGATIVE NEGATIVE Final   Influenza B by PCR NEGATIVE NEGATIVE Final    Comment: (NOTE) The Xpert Xpress SARS-CoV-2/FLU/RSV plus assay is intended as an aid in the diagnosis of influenza from Nasopharyngeal swab specimens and should not be used as a sole basis for treatment. Nasal washings and aspirates are unacceptable for Xpert Xpress SARS-CoV-2/FLU/RSV testing.  Fact Sheet for Patients: EntrepreneurPulse.com.au  Fact Sheet for Healthcare Providers: IncredibleEmployment.be  This test is not yet approved or cleared by the [place] FDA and has been authorized for detection and/or diagnosis of SARS-CoV-2 by FDA under an Emergency Use Authorization (EUA). This EUA will remain in effect (meaning this test can be used) for the duration of the COVID-19 declaration under Section 564(b)(1) of the Act, 21 U.S.C. section 360bbb-3(b)(1), unless the authorization is terminated or revoked.  Performed at [facility], [address] [place], [place] [address]     Coagulation Studies: No results for input(s): LABPROT, INR in the last 72 hours.  Urinalysis: No  results for input(s): COLORURINE, LABSPEC, PHURINE, GLUCOSEU, HGBUR, BILIRUBINUR, KETONESUR, PROTEINUR, UROBILINOGEN, NITRITE, LEUKOCYTESUR in the last 72 hours.  Invalid input(s): APPERANCEUR    Imaging: PERIPHERAL VASCULAR CATHETERIZATION  Result Date: 02/07/2021 See op note  DG Chest Port 1 View  Result Date: 02/07/2021 CLINICAL DATA:  Cough EXAM: PORTABLE CHEST 1 VIEW COMPARISON:  02/05/2021 FINDINGS: Prior CABG. Heart is normal size. No confluent opacities or effusions. No acute bony abnormality. Aortic atherosclerosis. IMPRESSION: No active disease. Electronically Signed   By: Rolm Baptise M.D.   On: 02/07/2021 07:56   DG Abd 2 Views  Result Date: 02/06/2021 CLINICAL DATA:  Peritoneal dialysis catheter malfunction EXAM: ABDOMEN - 2 VIEW COMPARISON:  12/26/2020 FINDINGS: Peritoneal dialysis catheter is located in the right lower quadrant, unchanged in position since prior study. Nonobstructive bowel gas pattern. Mild gaseous distention of the transverse colon. No organomegaly or free air. IMPRESSION: Peritoneal dialysis catheter in the right lower quadrant, unchanged in position since prior study. No acute findings. Electronically Signed   By: Rolm Baptise M.D.   On: 02/06/2021 16:50   ECHOCARDIOGRAM COMPLETE  Result Date: 02/08/2021    ECHOCARDIOGRAM REPORT   Patient Name:  Wesley Henderson Date of Exam: 02/07/2021 Medical Rec #:  [id number]         Height:       68.0 in Accession #:    [id number]        Weight:       177.7 lb Date of Birth:  07-11-1930          BSA:          1.944 m Patient Age:    89 years          BP:           132/58 mmHg Patient Gender: M                 HR:           75 bpm. Exam Location:  [facility] Procedure: 2D Echo, Cardiac Doppler and Color Doppler Indications:     CHF-acute diastolic [id number]  History:         Patient has no prior history of Echocardiogram examinations.                  Risk Factors:Hypertension and Diabetes.  Sonographer:     Sherrie Sport RDCS (AE)  Referring Phys:  [id number] [name] Diagnosing Phys: Serafina Royals MD  Sonographer Comments: Suboptimal apical window. IMPRESSIONS  1. Left ventricular ejection fraction, by estimation, is 60 to 65%. The left ventricle has normal function. The left ventricle has no regional wall motion abnormalities. Left ventricular diastolic parameters were normal.  2. Right ventricular systolic function is normal. The right ventricular size is normal.  3. The mitral valve is normal in structure. Trivial mitral valve regurgitation.  4. The aortic valve is normal in structure. Aortic valve regurgitation is not visualized. FINDINGS  Left Ventricle: Left ventricular ejection fraction, by estimation, is 60 to 65%. The left ventricle has normal function. The left ventricle has no regional wall motion abnormalities. The left ventricular internal cavity size was normal in size. There is  no left ventricular hypertrophy. Left ventricular diastolic parameters were normal. Right Ventricle: The right ventricular size is normal. No increase in right ventricular wall thickness. Right ventricular systolic function is normal. Left Atrium: Left atrial size was normal in size. Right Atrium: Right atrial size was normal in size. Pericardium: There is no evidence of pericardial effusion. Mitral Valve: The mitral valve is normal in structure. Trivial mitral valve regurgitation. Tricuspid Valve: The tricuspid valve is normal in structure. Tricuspid valve regurgitation is trivial. Aortic Valve: The aortic valve is normal in structure. Aortic valve regurgitation is not visualized. Aortic valve mean gradient measures 3.3 mmHg. Aortic valve peak gradient measures 5.8 mmHg. Aortic valve area, by VTI measures 1.17 cm. Pulmonic Valve: The pulmonic valve was normal in structure. Pulmonic valve regurgitation is not visualized. Aorta: The aortic root and ascending aorta are structurally normal, with no evidence of dilitation. IAS/Shunts: No atrial level  shunt detected by color flow Doppler.  LEFT VENTRICLE PLAX 2D LVIDd:         4.44 cm  Diastology LVIDs:         3.23 cm  LV e' medial:    15.20 cm/s LV PW:         0.95 cm  LV E/e' medial:  4.3 LV IVS:        1.19 cm  LV e' lateral:   8.70 cm/s LVOT diam:     2.00 cm  LV E/e' lateral: 7.6 LV SV:  30 LV SV Index:   15 LVOT Area:     3.14 cm  RIGHT VENTRICLE RV Basal diam:  3.21 cm RV S prime:     13.10 cm/s TAPSE (M-mode): 3.3 cm LEFT ATRIUM             Index       RIGHT ATRIUM           Index LA diam:        4.10 cm 2.11 cm/m  RA Area:     32.60 cm LA Vol (A2C):   50.3 ml 25.88 ml/m RA Volume:   124.00 ml 63.80 ml/m LA Vol (A4C):   91.6 ml 47.13 ml/m LA Biplane Vol: 73.8 ml 37.97 ml/m  AORTIC VALVE                   PULMONIC VALVE AV Area (Vmax):    1.40 cm    PV Vmax:        0.65 m/s AV Area (Vmean):   1.19 cm    PV Peak grad:   1.7 mmHg AV Area (VTI):     1.17 cm    RVOT Peak grad: 2 mmHg AV Vmax:           120.33 cm/s AV Vmean:          87.067 cm/s AV VTI:            0.254 m AV Peak Grad:      5.8 mmHg AV Mean Grad:      3.3 mmHg LVOT Vmax:         53.50 cm/s LVOT Vmean:        32.900 cm/s LVOT VTI:          0.095 m LVOT/AV VTI ratio: 0.37  AORTA Ao Root diam: 3.20 cm MITRAL VALVE                TRICUSPID VALVE MV Area (PHT): 5.88 cm     TR Peak grad:   21.0 mmHg MV Decel Time: 129 msec     TR Vmax:        229.00 cm/s MV E velocity: 66.00 cm/s MV A velocity: 115.00 cm/s  SHUNTS MV E/A ratio:  0.57         Systemic VTI:  0.09 m                             Systemic Diam: 2.00 cm Serafina Royals MD Electronically signed by Serafina Royals MD Signature Date/Time: 02/08/2021/7:57:03 AM    Final      Medications:    . amLODipine  5 mg Oral Daily  . aspirin EC  81 mg Oral QODAY  . carvedilol  12.5 mg Oral BID WC  . Chlorhexidine Gluconate Cloth  6 each Topical Q0600  . furosemide  60 mg Intravenous BID  . gentamicin cream  1 application Topical Daily  . heparin  5,000 Units Subcutaneous Q8H   . ipratropium-albuterol  3 mL Nebulization BID  . simvastatin  20 mg Oral QHS  . sodium chloride flush  3 mL Intravenous Q12H   benzonatate, guaiFENesin-dextromethorphan, HYDROmorphone (DILAUDID) injection, ondansetron **OR** ondansetron (ZOFRAN) IV, ondansetron (ZOFRAN) IV  Assessment/ Plan:  [name] is a 87 y.o. white male with end stage renal disease on peritoneal dialysis, hypertension, diabetes mellitus type II, hyperlipidemia, congestive heart failure who is admitted to [facility] on 02/05/2021 for Hyperkalemia [E87.5]  Generalized weakness [R53.1] Acute bronchitis, unspecified organism [J20.9]  [facility] 80kg Peritoneal Dialysis CCPD 8 hours 4 exchanges 2720m fills   1. End stage renal disease: with hyperkalemia -Due to concerns of ineffective PD treatments, will place patient on hemodialysis for proper treatment -access placed yesterday, Right IJ permcath - First hemodialysis treatment today -Lokelma discontinued - Furosemide '60mg'$  IV BID - hold telmisartan -we reviewed renal [place]aresults and labs with patient and wife  2. Hypertension: 104/62 - hypotensive. Home regimen of amlodipine, carvedilol, furosemide, and telmisartan -Improved BP on Amlodipine, Furosemide and Carvedilol  3. Anemia of chronic kidney disease:  - hemoglobin 8.6 and macrocytic - EPO and venofer as outpatient  4. Secondary Hyperparathyroidism: with hypocalcemia. Corrected calcium of 9.2. Not currently on binders.   5. Nutrition: albumin of 1.6 - cause for patient's hypotension and peripheral edema.  -Albumin 1.8 (02/07/21) -Will consider Albumin   LOS: 2 Jenaye Rickert 2/19/20222:29 PM

## 2021-02-09 DIAGNOSIS — E785 Hyperlipidemia, unspecified: Secondary | ICD-10-CM

## 2021-02-09 LAB — GLUCOSE, CAPILLARY
Glucose-Capillary: 138 mg/dL — ABNORMAL HIGH (ref 70–99)
Glucose-Capillary: 115 mg/dL — ABNORMAL HIGH (ref 70–99)
Glucose-Capillary: 152 mg/dL — ABNORMAL HIGH (ref 70–99)

## 2021-02-09 MED ORDER — POLYETHYLENE GLYCOL 3350 17 G PO PACK
17.0000 g | PACK | Freq: Every day | ORAL | Status: DC
  Administered 2021-02-09 – 2021-02-10 (×2): 17 g via ORAL
  Filled 2021-02-09 (×2): qty 1

## 2021-02-09 MED ORDER — TORSEMIDE 20 MG PO TABS
40.0000 mg | ORAL_TABLET | Freq: Every day | ORAL | Status: DC
  Administered 2021-02-10: 40 mg via ORAL
  Filled 2021-02-09: qty 2

## 2021-02-09 NOTE — Progress Notes (Addendum)
[facility]  ROUNDING NOTE   Subjective:   [name] was admitted to [facility] on 02/05/2021 for Hyperkalemia [E87.5] Generalized weakness [R53.1] Acute bronchitis, unspecified organism [J20.9]  Last peritoneal treatment was last night  Patient was found to have hypokalemia and then his primary nephrologist ordered potassium chloride. Since starting this medication, patient has been tired and weak. Yesterday, patient had follow up labs with a serum potassium of 6.2.   Brought to ED with a potassium of 6.4.   Patient seen while eating breakfast Alert and oriented Says he had a good dialysis treatment yesterday Denies shortness of breath and chest pain Able to tolerate meals with no nausea   Objective:  Vital signs in last 24 hours:  Temp:  [97.5 F (36.4 C)-98.6 F (37 C)] 97.6 F (36.4 C) (02/20 1145) Pulse Rate:  [52-79] 77 (02/20 1145) Resp:  [16-33] 18 (02/20 1145) BP: (90-124)/(47-66) 123/65 (02/20 1145) SpO2:  [90 %-97 %] 94 % (02/20 1145) Weight:  [81.3 kg] 81.3 kg (02/20 0409)  Weight change: 1.043 kg Filed Weights   02/07/21 0443 02/08/21 0200 02/09/21 0409  Weight: 80.6 kg 80.3 kg 81.3 kg    Intake/Output: I/O last 3 completed shifts: In: 413.3 [P.O.:360; IV Piggyback:53.3] Out: 300 [Urine:300]   Intake/Output this shift:  No intake/output data recorded.  Physical Exam: General: NAD,   Head: Normocephalic, atraumatic. Moist oral mucosal membranes  Eyes: Anicteric,   Neck: Supple, trachea midline  Lungs:  Wheezing in bilateral bases, nonproductive moist cough  Heart: Regular rate and rhythm  Abdomen:  Soft, nontender,   Extremities:  + peripheral edema.  Neurologic: Nonfocal, moving all four extremities  Skin: No lesions  Access: PD catheter, Right IJ permcath    Basic Metabolic Panel: Recent Labs  Lab 02/05/21 0928 02/05/21 1509 02/06/21 0458 02/07/21 1240 02/08/21 0531  NA 132* 131* 131* 131* 132*  K 6.4* 6.1* 5.2*  4.5 4.1  CL 97* 99 97* 96* 96*  CO2 '26 24 26 26 25  '$ GLUCOSE 153* 61* 172* 136* 114*  BUN 33* 34* 35* 34* 39*  CREATININE 4.66* 4.72* 4.47* 4.66* 5.00*  CALCIUM 7.4* 7.3* 7.2* 7.2* 6.9*  PHOS  --   --   --  4.1  4.2  --     Liver Function Tests: Recent Labs  Lab 02/05/21 1107 02/07/21 1240  AST 27  --   ALT 18  --   ALKPHOS 76  --   BILITOT 0.7  --   PROT 5.0*  --   ALBUMIN 1.6* 1.8*   No results for input(s): LIPASE, AMYLASE in the last 168 hours. No results for input(s): AMMONIA in the last 168 hours.  CBC: Recent Labs  Lab 02/05/21 0928 02/06/21 0458  WBC 14.1* 9.2  HGB 8.6* 8.6*  HCT 26.2* 25.4*  MCV 101.9* 100.0  PLT 322 313    Cardiac Enzymes: Recent Labs  Lab 02/05/21 1509  CKTOTAL 207    BNP: Invalid input(s): POCBNP  CBG: Recent Labs  Lab 02/07/21 2009 02/08/21 0725 02/08/21 2014 02/09/21 0740 02/09/21 1144  GLUCAP 143* 117* 138* 115* 152*    Microbiology: Results for orders placed or performed during the hospital encounter of 02/05/21  Resp Panel by RT-PCR (Flu A&B, Covid) Nasopharyngeal Swab     Status: None   Collection Time: 02/05/21 11:07 AM   Specimen: Nasopharyngeal Swab; Nasopharyngeal(NP) swabs in vial transport medium  Result Value Ref Range Status   SARS Coronavirus 2 by RT  PCR NEGATIVE NEGATIVE Final    Comment: (NOTE) SARS-CoV-2 target nucleic acids are NOT DETECTED.  The SARS-CoV-2 RNA is generally detectable in upper respiratory specimens during the acute phase of infection. The lowest concentration of SARS-CoV-2 viral copies this assay can detect is 138 copies/mL. A negative result does not preclude SARS-Cov-2 infection and should not be used as the sole basis for treatment or other patient management decisions. A negative result may occur with  improper specimen collection/handling, submission of specimen other than nasopharyngeal swab, presence of viral mutation(s) within the areas targeted by this assay, and  inadequate number of viral copies(<138 copies/mL). A negative result must be combined with clinical observations, patient history, and epidemiological information. The expected result is Negative.  Fact Sheet for Patients:  EntrepreneurPulse.com.au  Fact Sheet for Healthcare Providers:  IncredibleEmployment.be  This test is no t yet approved or cleared by the [place] FDA and  has been authorized for detection and/or diagnosis of SARS-CoV-2 by FDA under an Emergency Use Authorization (EUA). This EUA will remain  in effect (meaning this test can be used) for the duration of the COVID-19 declaration under Section 564(b)(1) of the Act, 21 U.S.C.section 360bbb-3(b)(1), unless the authorization is terminated  or revoked sooner.       Influenza A by PCR NEGATIVE NEGATIVE Final   Influenza B by PCR NEGATIVE NEGATIVE Final    Comment: (NOTE) The Xpert Xpress SARS-CoV-2/FLU/RSV plus assay is intended as an aid in the diagnosis of influenza from Nasopharyngeal swab specimens and should not be used as a sole basis for treatment. Nasal washings and aspirates are unacceptable for Xpert Xpress SARS-CoV-2/FLU/RSV testing.  Fact Sheet for Patients: EntrepreneurPulse.com.au  Fact Sheet for Healthcare Providers: IncredibleEmployment.be  This test is not yet approved or cleared by the [place] FDA and has been authorized for detection and/or diagnosis of SARS-CoV-2 by FDA under an Emergency Use Authorization (EUA). This EUA will remain in effect (meaning this test can be used) for the duration of the COVID-19 declaration under Section 564(b)(1) of the Act, 21 U.S.C. section 360bbb-3(b)(1), unless the authorization is terminated or revoked.  Performed at [facility], [address] [place], [place] [address]     Coagulation Studies: No results for input(s): LABPROT, INR in the last 72  hours.  Urinalysis: No results for input(s): COLORURINE, LABSPEC, PHURINE, GLUCOSEU, HGBUR, BILIRUBINUR, KETONESUR, PROTEINUR, UROBILINOGEN, NITRITE, LEUKOCYTESUR in the last 72 hours.  Invalid input(s): APPERANCEUR    Imaging: PERIPHERAL VASCULAR CATHETERIZATION  Result Date: 02/07/2021 See op note    Medications:    . aspirin EC  81 mg Oral QODAY  . carvedilol  12.5 mg Oral BID WC  . Chlorhexidine Gluconate Cloth  6 each Topical Q0600  . furosemide  60 mg Intravenous BID  . heparin  5,000 Units Subcutaneous Q8H  . ipratropium-albuterol  3 mL Nebulization BID  . simvastatin  20 mg Oral QHS  . sodium chloride flush  3 mL Intravenous Q12H   benzonatate, guaiFENesin-dextromethorphan, heparin sodium (porcine), heparin sodium (porcine), HYDROmorphone (DILAUDID) injection, ondansetron **OR** ondansetron (ZOFRAN) IV, ondansetron (ZOFRAN) IV  Assessment/ Plan:  Wesley Henderson is a 87 y.o. white male with end stage renal disease on peritoneal dialysis, hypertension, diabetes mellitus type II, hyperlipidemia, congestive heart failure who is admitted to [facility] on 02/05/2021 for Hyperkalemia [E87.5] Generalized weakness [R53.1] Acute bronchitis, unspecified organism [J20.9]  [place] 80kg Peritoneal Dialysis CCPD 8 hours 4 exchanges 2719m fills   1. End stage renal  disease: with hyperkalemia -Due to concerns of ineffective PD treatments, will place patient on hemodialysis for proper treatment -02/07/21, Right IJ permcath - Start torsemide daily instead of IV Furosemide - Continue holding telmisartan -discussed with patient and wife the plan to continue HD until patient is able to manage PD.  -Patient and wife desire home with PT and wife states she can take patient to dialysis. They prefer the [facility] location.   2.  Anemia of chronic kidney disease:  Lab Results  Component Value Date   HGB 8.6 (L) 02/06/2021    - EPO and venofer as outpatient  3. Secondary  Hyperparathyroidism: with hypocalcemia. Corrected calcium of 9.2. Not currently on binders.   4.Malnutrition: albumin of 1.6 - cause for patient's hypotension and peripheral edema.  -Albumin 1.8 (02/07/21)    LOS: 3 Wesley Henderson 2/20/20221:52 PM

## 2021-02-09 NOTE — Progress Notes (Signed)
Physical Therapy Treatment Patient Details Name: Wesley Henderson MRN: [id number] DOB: 1930/05/10 Today's Date: 02/09/2021    History of Present Illness Pt admitted for hyperkalemia with complaints of weakness/falls. History includes ESRD on PD, DM, CHF, CAD, anxiety, and HTN. Pt had insertion of a right IJ tunneled dialysis catheter on 02/17/21.    PT Comments    Pt was pleasant and motivated to participate during the session.  Pt put forth very good effort with all below therex and was motivated to ambulate. Pt required occasional cueing for proper sequencing with transfers and gait for safety but was generally steady without LOB.  Pt's SpO2 and HR were both WNL during the session with patient reporting "Moderate" effort with amb 200' and did present with min to mod SOB that resolved quickly upon returning to sitting.  Pt will benefit from HHPT services upon discharge to safely address deficits listed in patient problem list for decreased caregiver assistance and eventual return to PLOF.     Follow Up Recommendations  Home health PT     Equipment Recommendations  None recommended by PT    Recommendations for Other Services       Precautions / Restrictions Precautions Precautions: Fall Restrictions Weight Bearing Restrictions: No    Mobility  Bed Mobility Overal bed mobility: Modified Independent Bed Mobility: Supine to Sit;Sit to Supine           General bed mobility comments: Extra time and effort only    Transfers Overall transfer level: Needs assistance Equipment used: Rolling walker (2 wheeled) Transfers: Sit to/from Stand Sit to Stand: Min guard         General transfer comment: Min verbal cues for sequencing for general safety including hand placement and limiting backwards ambulation when navigating to sit down  Ambulation/Gait Ambulation/Gait assistance: Min guard Gait Distance (Feet): 200 Feet Assistive device: Rolling walker (2 wheeled) Gait  Pattern/deviations: Step-through pattern;Decreased stride length;Trunk flexed     General Gait Details: Min verbal cues for amb closer to the RW and to stay inside of RW during turns   Chief Strategy Officer    Modified Rankin (Stroke Patients Only)       Balance Overall balance assessment: Needs assistance;History of Falls Sitting-balance support: Feet supported Sitting balance-Leahy Scale: Good     Standing balance support: Bilateral upper extremity supported;During functional activity Standing balance-Leahy Scale: Good                              Cognition Arousal/Alertness: Awake/alert Behavior During Therapy: WFL for tasks assessed/performed Overall Cognitive Status: Within Functional Limits for tasks assessed                                        Exercises Total Joint Exercises Ankle Circles/Pumps: Strengthening;Both;10 reps;5 reps (with resistance) Quad Sets: Strengthening;Both;5 reps;10 reps Heel Slides: Strengthening;Both;10 reps Hip ABduction/ADduction: Strengthening;Both;10 reps (with resistance) Straight Leg Raises: Strengthening;Both;10 reps Long Arc Quad: Strengthening;Both;10 reps    General Comments        Pertinent Vitals/Pain Pain Assessment: No/denies pain    Home Living                      Prior Function  PT Goals (current goals can now be found in the care plan section) Progress towards PT goals: Progressing toward goals    Frequency    Min 2X/week      PT Plan Current plan remains appropriate    Co-evaluation              AM-PAC PT "6 Clicks" Mobility   Outcome Measure  Help needed turning from your back to your side while in a flat bed without using bedrails?: None Help needed moving from lying on your back to sitting on the side of a flat bed without using bedrails?: None Help needed moving to and from a bed to a chair (including a  wheelchair)?: A Little Help needed standing up from a chair using your arms (e.g., wheelchair or bedside chair)?: A Little Help needed to walk in hospital room?: A Little Help needed climbing 3-5 steps with a railing? : A Little 6 Click Score: 20    End of Session Equipment Utilized During Treatment: Gait belt Activity Tolerance: Patient tolerated treatment well Patient left: in bed;with bed alarm set;with call bell/phone within reach Nurse Communication: Mobility status PT Visit Diagnosis: Unsteadiness on feet (R26.81);Muscle weakness (generalized) (M62.81);History of falling (Z91.81);Difficulty in walking, not elsewhere classified (R26.2)     Time: [id number] PT Time Calculation (min) (ACUTE ONLY): 25 min  Charges:  $Gait Training: 8-22 mins $Therapeutic Exercise: 8-22 mins                     D. Scott Embree Brawley PT, DPT 02/09/21, 2:06 PM

## 2021-02-09 NOTE — Progress Notes (Signed)
Patient ID: Wesley Henderson, male   DOB: 06-22-1930, 87 y.o.   MRN: [id number] [facility] PROGRESS NOTE  VI KANAN [id number] DOB: [birth year] DOA: 02/05/2021 PCP: [name], MD  HPI/Subjective: Patient still with some cough.  Feels like his breathing is okay.  Admitted initially with hyperkalemia.  Switched from peritoneal dialysis to hemodialysis to remove fluid.  Objective: Vitals:   02/09/21 0811 02/09/21 1145  BP:  123/65  Pulse:  77  Resp:  18  Temp:  97.6 F (36.4 C)  SpO2: 90% 94%    Intake/Output Summary (Last 24 hours) at 02/09/2021 1442 Last data filed at 02/09/2021 1431 Gross per 24 hour  Intake 53.33 ml  Output 0 ml  Net 53.33 ml   Filed Weights   02/07/21 0443 02/08/21 0200 02/09/21 0409  Weight: 80.6 kg 80.3 kg 81.3 kg    ROS: Review of Systems  Respiratory: Positive for cough. Negative for shortness of breath.   Cardiovascular: Negative for chest pain.  Gastrointestinal: Negative for abdominal pain, nausea and vomiting.   Exam: Physical Exam HENT:     Head: Normocephalic.     Mouth/Throat:     Pharynx: No oropharyngeal exudate.  Eyes:     General: Lids are normal.     Conjunctiva/sclera: Conjunctivae normal.  Cardiovascular:     Rate and Rhythm: Normal rate and regular rhythm.     Heart sounds: Normal heart sounds, S1 normal and S2 normal.  Pulmonary:     Breath sounds: Examination of the right-lower field reveals decreased breath sounds and wheezing. Examination of the left-lower field reveals decreased breath sounds and wheezing. Decreased breath sounds and wheezing present. No rhonchi or rales.  Abdominal:     Palpations: Abdomen is soft.     Tenderness: There is no abdominal tenderness.  Musculoskeletal:     Right ankle: Swelling present.     Left ankle: Swelling present.  Skin:    General: Skin is warm.     Findings: No rash.  Neurological:     Mental Status: He is alert and oriented to person, place, and time.        Data Reviewed: Basic Metabolic Panel: Recent Labs  Lab 02/05/21 0928 02/05/21 1509 02/06/21 0458 02/07/21 1240 02/08/21 0531  NA 132* 131* 131* 131* 132*  K 6.4* 6.1* 5.2* 4.5 4.1  CL 97* 99 97* 96* 96*  CO2 '26 24 26 26 25  '$ GLUCOSE 153* 61* 172* 136* 114*  BUN 33* 34* 35* 34* 39*  CREATININE 4.66* 4.72* 4.47* 4.66* 5.00*  CALCIUM 7.4* 7.3* 7.2* 7.2* 6.9*  PHOS  --   --   --  4.1  4.2  --    Liver Function Tests: Recent Labs  Lab 02/05/21 1107 02/07/21 1240  AST 27  --   ALT 18  --   ALKPHOS 76  --   BILITOT 0.7  --   PROT 5.0*  --   ALBUMIN 1.6* 1.8*   CBC: Recent Labs  Lab 02/05/21 0928 02/06/21 0458  WBC 14.1* 9.2  HGB 8.6* 8.6*  HCT 26.2* 25.4*  MCV 101.9* 100.0  PLT 322 313   Cardiac Enzymes: Recent Labs  Lab 02/05/21 1509  CKTOTAL 207   BNP (last 3 results) Recent Labs    02/05/21 1345  BNP 368.6*     CBG: Recent Labs  Lab 02/07/21 2009 02/08/21 0725 02/08/21 2014 02/09/21 0740 02/09/21 1144  GLUCAP 143* 117* 138* 115* 152*    Recent Results (  from the past 240 hour(s))  Resp Panel by RT-PCR (Flu A&B, Covid) Nasopharyngeal Swab     Status: None   Collection Time: 02/05/21 11:07 AM   Specimen: Nasopharyngeal Swab; Nasopharyngeal(NP) swabs in vial transport medium  Result Value Ref Range Status   SARS Coronavirus 2 by RT PCR NEGATIVE NEGATIVE Final    Comment: (NOTE) SARS-CoV-2 target nucleic acids are NOT DETECTED.  The SARS-CoV-2 RNA is generally detectable in upper respiratory specimens during the acute phase of infection. The lowest concentration of SARS-CoV-2 viral copies this assay can detect is 138 copies/mL. A negative result does not preclude SARS-Cov-2 infection and should not be used as the sole basis for treatment or other patient management decisions. A negative result may occur with  improper specimen collection/handling, submission of specimen other than nasopharyngeal swab, presence of viral mutation(s)  within the areas targeted by this assay, and inadequate number of viral copies(<138 copies/mL). A negative result must be combined with clinical observations, patient history, and epidemiological information. The expected result is Negative.  Fact Sheet for Patients:  EntrepreneurPulse.com.au  Fact Sheet for Healthcare Providers:  IncredibleEmployment.be  This test is no t yet approved or cleared by the [place] FDA and  has been authorized for detection and/or diagnosis of SARS-CoV-2 by FDA under an Emergency Use Authorization (EUA). This EUA will remain  in effect (meaning this test can be used) for the duration of the COVID-19 declaration under Section 564(b)(1) of the Act, 21 U.S.C.section 360bbb-3(b)(1), unless the authorization is terminated  or revoked sooner.       Influenza A by PCR NEGATIVE NEGATIVE Final   Influenza B by PCR NEGATIVE NEGATIVE Final    Comment: (NOTE) The Xpert Xpress SARS-CoV-2/FLU/RSV plus assay is intended as an aid in the diagnosis of influenza from Nasopharyngeal swab specimens and should not be used as a sole basis for treatment. Nasal washings and aspirates are unacceptable for Xpert Xpress SARS-CoV-2/FLU/RSV testing.  Fact Sheet for Patients: EntrepreneurPulse.com.au  Fact Sheet for Healthcare Providers: IncredibleEmployment.be  This test is not yet approved or cleared by the [place] FDA and has been authorized for detection and/or diagnosis of SARS-CoV-2 by FDA under an Emergency Use Authorization (EUA). This EUA will remain in effect (meaning this test can be used) for the duration of the COVID-19 declaration under Section 564(b)(1) of the Act, 21 U.S.C. section 360bbb-3(b)(1), unless the authorization is terminated or revoked.  Performed at [facility], [address] [employer], [place] [address]      Studies: PERIPHERAL VASCULAR  CATHETERIZATION  Result Date: 02/07/2021 See op note   Scheduled Meds: . aspirin EC  81 mg Oral QODAY  . carvedilol  12.5 mg Oral BID WC  . Chlorhexidine Gluconate Cloth  6 each Topical Q0600  . furosemide  60 mg Intravenous BID  . heparin  5,000 Units Subcutaneous Q8H  . ipratropium-albuterol  3 mL Nebulization BID  . simvastatin  20 mg Oral QHS  . sodium chloride flush  3 mL Intravenous Q12H    Assessment/Plan:  1. Acute diastolic congestive heart failure.  Patient with cardiac wheeze.  Patient converted from peritoneal dialysis to hemodialysis.  Had first hemodialysis session yesterday.  Nephrology holding dialysis today and will do on Monday.  Patient still on IV Lasix.  On Coreg. 2. End-stage renal disease with hyperkalemia.  Had hemodialysis yesterday.  Continue to hold telmisartan. 3. Weakness and falls.  Did well with physical therapy to go home with home health. 4. Essential  hypertension on Coreg.  Norvasc discontinued. 5. Type 2 diabetes mellitus with end-stage renal disease 6. Hyperlipidemia unspecified on simvastatin 7. Renal cyst on sonogram 8. Anemia of chronic disease.  Recheck hemoglobin tomorrow 9. Secondary hyperparathyroidism 10. Low albumin         Code Status:     Code Status Orders  (From admission, onward)         Start     Ordered   02/05/21 1422  Full code  Continuous        02/05/21 1423        Code Status History    This patient has a current code status but no historical code status.   Advance Care Planning Activity     Family Communication: Tried to call wife on the phone but ended up speaking with the patient again Disposition Plan: Status is: Inpatient  Dispo: The patient is from: Home              Anticipated d/c is to: Home with home health              Anticipated d/c date is: Whenever we get an outpatient dialysis slot              Patient currently will receive another dialysis session in the hospital   Difficult to place  patient.  No.  Time spent: 27 minutes  [place]

## 2021-02-10 ENCOUNTER — Encounter: Payer: Self-pay | Admitting: Vascular Surgery

## 2021-02-10 LAB — BASIC METABOLIC PANEL
Anion gap: 8 (ref 5–15)
BUN: 44 mg/dL — ABNORMAL HIGH (ref 8–23)
CO2: 27 mmol/L (ref 22–32)
Calcium: 6.8 mg/dL — ABNORMAL LOW (ref 8.9–10.3)
Chloride: 98 mmol/L (ref 98–111)
Creatinine, Ser: 4.75 mg/dL — ABNORMAL HIGH (ref 0.61–1.24)
GFR, Estimated: 11 mL/min — ABNORMAL LOW (ref >60)
Glucose, Bld: 119 mg/dL — ABNORMAL HIGH (ref 70–99)
Potassium: 3.7 mmol/L (ref 3.5–5.1)
Sodium: 133 mmol/L — ABNORMAL LOW (ref 135–145)

## 2021-02-10 LAB — CBC
HCT: 23.8 % — ABNORMAL LOW (ref 39.0–52.0)
Hemoglobin: 8.2 g/dL — ABNORMAL LOW (ref 13.0–17.0)
MCH: 33.5 pg (ref 26.0–34.0)
MCHC: 34.5 g/dL (ref 30.0–36.0)
MCV: 97.1 fL (ref 80.0–100.0)
Platelets: 294 10*3/uL (ref 150–400)
RBC: 2.45 MIL/uL — ABNORMAL LOW (ref 4.22–5.81)
RDW: 12.5 % (ref 11.5–15.5)
WBC: 8.1 10*3/uL (ref 4.0–10.5)
nRBC: 0 % (ref 0.0–0.2)

## 2021-02-10 LAB — PROTEIN ELECTROPHORESIS, SERUM
A/G Ratio: 0.6 — ABNORMAL LOW (ref 0.7–1.7)
Albumin ELP: 1.8 g/dL — ABNORMAL LOW (ref 2.9–4.4)
Alpha-1-Globulin: 0.4 g/dL (ref 0.0–0.4)
Alpha-2-Globulin: 0.9 g/dL (ref 0.4–1.0)
Beta Globulin: 0.6 g/dL — ABNORMAL LOW (ref 0.7–1.3)
Gamma Globulin: 1.3 g/dL (ref 0.4–1.8)
Globulin, Total: 3.2 g/dL (ref 2.2–3.9)
Total Protein ELP: 5 g/dL — ABNORMAL LOW (ref 6.0–8.5)

## 2021-02-10 LAB — PROTEIN ELECTRO, RANDOM URINE
Albumin ELP, Urine: 16.6 %
Alpha-1-Globulin, U: 7.9 %
Alpha-2-Globulin, U: 17.5 %
Beta Globulin, U: 43.2 %
Gamma Globulin, U: 14.8 %
Total Protein, Urine: 10.7 mg/dL

## 2021-02-10 MED ORDER — IPRATROPIUM BROMIDE 0.06 % NA SOLN: 2.0000 | Freq: Two times a day (BID) | NASAL | 1 refills | Status: DC

## 2021-02-10 MED ORDER — IPRATROPIUM BROMIDE 0.06 % NA SOLN
2.0000 | Freq: Two times a day (BID) | NASAL | Status: DC
  Administered 2021-02-10: 2 via NASAL
  Filled 2021-02-10: qty 15

## 2021-02-10 MED ORDER — BENZONATATE 100 MG PO CAPS: 100.0000 mg | ORAL_CAPSULE | Freq: Three times a day (TID) | ORAL | 0 refills | Status: DC | PRN

## 2021-02-10 MED ORDER — GUAIFENESIN-DM 100-10 MG/5ML PO SYRP: 5.0000 mL | ORAL_SOLUTION | ORAL | 0 refills | Status: DC | PRN

## 2021-02-10 MED ORDER — POLYETHYLENE GLYCOL 3350 17 G PO PACK: 17.0000 g | PACK | Freq: Every day | ORAL | 0 refills | Status: DC | PRN

## 2021-02-10 MED ORDER — IPRATROPIUM-ALBUTEROL 0.5-2.5 (3) MG/3ML IN SOLN: 3.0000 mL | RESPIRATORY_TRACT | Status: DC | PRN

## 2021-02-10 MED ORDER — TORSEMIDE 40 MG PO TABS: 40.0000 mg | ORAL_TABLET | Freq: Every day | ORAL | 0 refills | Status: DC

## 2021-02-10 MED ORDER — ALBUTEROL SULFATE HFA 108 (90 BASE) MCG/ACT IN AERS: 2.0000 | INHALATION_SPRAY | Freq: Four times a day (QID) | RESPIRATORY_TRACT | 0 refills | Status: AC | PRN

## 2021-02-10 NOTE — Progress Notes (Signed)
   02/08/21 1605 02/08/21 1610 02/08/21 1615  Vitals  BP (!) 106/47  --  122/65  MAP (mmHg) 66  --  83  Pulse Rate 70 77 73  ECG Heart Rate 60 74 68  Resp (!) 24 (!) 24 19  During Hemodialysis Assessment  Blood Flow Rate (mL/min)  --   --  200 mL/min  Arterial Pressure (mmHg)  --   --  -70 mmHg  Venous Pressure (mmHg)  --   --  70 mmHg  Transmembrane Pressure (mmHg)  --   --  40 mmHg  Ultrafiltration Rate (mL/min)  --   --  250 mL/min  Dialysate Flow Rate (mL/min)  --   --  300 ml/min  Conductivity: Machine   --   --  13.8  HD Safety Checks Performed  --   --  Yes  Dialysis Fluid Bolus  --   --   --   Bolus Amount (mL)  --   --   --   Dialysate Change  --   --   --   Intra-Hemodialysis Comments  --   --  Progressing as prescribed    02/08/21 1620 02/08/21 1645  Vitals  BP (!) 116/58 122/64  MAP (mmHg) 75 83  Pulse Rate 74 (!) 52  ECG Heart Rate 71 74  Resp (!) 23 (!) 23  During Hemodialysis Assessment  Blood Flow Rate (mL/min) 200 mL/min 200 mL/min  Arterial Pressure (mmHg) -70 mmHg  --   Venous Pressure (mmHg) 70 mmHg  --   Transmembrane Pressure (mmHg) 30 mmHg  --   Ultrafiltration Rate (mL/min) 260 mL/min  --   Dialysate Flow Rate (mL/min) 300 ml/min  --   Conductivity: Machine  13.8  --   HD Safety Checks Performed Yes  --   Dialysis Fluid Bolus Normal Saline Normal Saline  Bolus Amount (mL) 0 mL 300 mL  Dialysate Change  (no)  --   Intra-Hemodialysis Comments Progressing as prescribed (Handoff from [place], RN pt in afib at baseline and asymptomatic,) Tx completed

## 2021-02-10 NOTE — Progress Notes (Signed)
Blood lines reversed due to high AP, RN aware. 1 ml bolus heparin given per doctor.

## 2021-02-10 NOTE — Progress Notes (Signed)
[facility]  ROUNDING NOTE   Subjective:   [name] was admitted to [facility] on 02/05/2021 for Hyperkalemia [E87.5] Generalized weakness [R53.1] Acute bronchitis, unspecified organism [J20.9]  Last peritoneal treatment was last night  Patient was found to have hypokalemia and then his primary nephrologist ordered potassium chloride. Since starting this medication, patient has been tired and weak. Yesterday, patient had follow up labs with a serum potassium of 6.2.   Brought to ED with a potassium of 6.4.   Patient seen while eating breakfast Wife at bedside Voices concerns about low BP during dialysis Alert and oriented Denies shortness of breath and chest pain Able to tolerate meals with no nausea   Objective:  Vital signs in last 24 hours:  Temp:  [97.6 F (36.4 C)-98.5 F (36.9 C)] 98.2 F (36.8 C) (02/21 1028) Pulse Rate:  [61-79] 70 (02/21 1230) Resp:  [18-30] 20 (02/21 1230) BP: (98-131)/(46-88) 128/73 (02/21 1230) SpO2:  [93 %-96 %] 95 % (02/21 0753) Weight:  [82.3 kg] 82.3 kg (02/21 0350)  Weight change: 0.998 kg Filed Weights   02/08/21 0200 02/09/21 0409 02/10/21 0350  Weight: 80.3 kg 81.3 kg 82.3 kg    Intake/Output: No intake/output data recorded.   Intake/Output this shift:  Total I/O In: 480 [P.O.:480] Out: -   Physical Exam: General: NAD,   Head: Normocephalic, atraumatic. Moist oral mucosal membranes  Eyes: Anicteric,   Neck: Supple, trachea midline  Lungs:  Wheezing in bilateral bases, nonproductive moist cough  Heart: Regular rate and rhythm  Abdomen:  Soft, nontender,   Extremities:  + peripheral edema.  Neurologic: Nonfocal, moving all four extremities  Skin: No lesions  Access: PD catheter, Right IJ permcath    Basic Metabolic Panel: Recent Labs  Lab 02/05/21 1509 02/06/21 0458 02/07/21 1240 02/08/21 0531 02/10/21 0457  NA 131* 131* 131* 132* 133*  K 6.1* 5.2* 4.5 4.1 3.7  CL 99 97* 96* 96* 98  CO2 '24  26 26 25 27  '$ GLUCOSE 61* 172* 136* 114* 119*  BUN 34* 35* 34* 39* 44*  CREATININE 4.72* 4.47* 4.66* 5.00* 4.75*  CALCIUM 7.3* 7.2* 7.2* 6.9* 6.8*  PHOS  --   --  4.1  4.2  --   --     Liver Function Tests: Recent Labs  Lab 02/05/21 1107 02/07/21 1240  AST 27  --   ALT 18  --   ALKPHOS 76  --   BILITOT 0.7  --   PROT 5.0*  --   ALBUMIN 1.6* 1.8*   No results for input(s): LIPASE, AMYLASE in the last 168 hours. No results for input(s): AMMONIA in the last 168 hours.  CBC: Recent Labs  Lab 02/05/21 0928 02/06/21 0458 02/10/21 0457  WBC 14.1* 9.2 8.1  HGB 8.6* 8.6* 8.2*  HCT 26.2* 25.4* 23.8*  MCV 101.9* 100.0 97.1  PLT 322 313 294    Cardiac Enzymes: Recent Labs  Lab 02/05/21 1509  CKTOTAL 207    BNP: Invalid input(s): POCBNP  CBG: Recent Labs  Lab 02/07/21 2009 02/08/21 0725 02/08/21 2014 02/09/21 0740 02/09/21 1144  GLUCAP 143* 117* 138* 115* 152*    Microbiology: Results for orders placed or performed during the hospital encounter of 02/05/21  Resp Panel by RT-PCR (Flu A&B, Covid) Nasopharyngeal Swab     Status: None   Collection Time: 02/05/21 11:07 AM   Specimen: Nasopharyngeal Swab; Nasopharyngeal(NP) swabs in vial transport medium  Result Value Ref Range Status   SARS  Coronavirus 2 by RT PCR NEGATIVE NEGATIVE Final    Comment: (NOTE) SARS-CoV-2 target nucleic acids are NOT DETECTED.  The SARS-CoV-2 RNA is generally detectable in upper respiratory specimens during the acute phase of infection. The lowest concentration of SARS-CoV-2 viral copies this assay can detect is 138 copies/mL. A negative result does not preclude SARS-Cov-2 infection and should not be used as the sole basis for treatment or other patient management decisions. A negative result may occur with  improper specimen collection/handling, submission of specimen other than nasopharyngeal swab, presence of viral mutation(s) within the areas targeted by this assay, and  inadequate number of viral copies(<138 copies/mL). A negative result must be combined with clinical observations, patient history, and epidemiological information. The expected result is Negative.  Fact Sheet for Patients:  EntrepreneurPulse.com.au  Fact Sheet for Healthcare Providers:  IncredibleEmployment.be  This test is no t yet approved or cleared by the [place] FDA and  has been authorized for detection and/or diagnosis of SARS-CoV-2 by FDA under an Emergency Use Authorization (EUA). This EUA will remain  in effect (meaning this test can be used) for the duration of the COVID-19 declaration under Section 564(b)(1) of the Act, 21 U.S.C.section 360bbb-3(b)(1), unless the authorization is terminated  or revoked sooner.       Influenza A by PCR NEGATIVE NEGATIVE Final   Influenza B by PCR NEGATIVE NEGATIVE Final    Comment: (NOTE) The Xpert Xpress SARS-CoV-2/FLU/RSV plus assay is intended as an aid in the diagnosis of influenza from Nasopharyngeal swab specimens and should not be used as a sole basis for treatment. Nasal washings and aspirates are unacceptable for Xpert Xpress SARS-CoV-2/FLU/RSV testing.  Fact Sheet for Patients: EntrepreneurPulse.com.au  Fact Sheet for Healthcare Providers: IncredibleEmployment.be  This test is not yet approved or cleared by the [place] FDA and has been authorized for detection and/or diagnosis of SARS-CoV-2 by FDA under an Emergency Use Authorization (EUA). This EUA will remain in effect (meaning this test can be used) for the duration of the COVID-19 declaration under Section 564(b)(1) of the Act, 21 U.S.C. section 360bbb-3(b)(1), unless the authorization is terminated or revoked.  Performed at [facility], [address] [place], [place] [address]     Coagulation Studies: No results for input(s): LABPROT, INR in the last 72  hours.  Urinalysis: No results for input(s): COLORURINE, LABSPEC, PHURINE, GLUCOSEU, HGBUR, BILIRUBINUR, KETONESUR, PROTEINUR, UROBILINOGEN, NITRITE, LEUKOCYTESUR in the last 72 hours.  Invalid input(s): APPERANCEUR    Imaging: No results found.   Medications:    . aspirin EC  81 mg Oral QODAY  . carvedilol  12.5 mg Oral BID WC  . Chlorhexidine Gluconate Cloth  6 each Topical Q0600  . heparin  5,000 Units Subcutaneous Q8H  . ipratropium  2 spray Each Nare BID  . polyethylene glycol  17 g Oral Daily  . simvastatin  20 mg Oral QHS  . sodium chloride flush  3 mL Intravenous Q12H  . torsemide  40 mg Oral Daily   benzonatate, guaiFENesin-dextromethorphan, heparin sodium (porcine), heparin sodium (porcine), HYDROmorphone (DILAUDID) injection, ipratropium-albuterol, ondansetron **OR** ondansetron (ZOFRAN) IV, ondansetron (ZOFRAN) IV  Assessment/ Plan:  [name] is a 87 y.o. white male with end stage renal disease on peritoneal dialysis, hypertension, diabetes mellitus type II, hyperlipidemia, congestive heart failure who is admitted to [facility] on 02/05/2021 for Hyperkalemia [E87.5] Generalized weakness [R53.1] Acute bronchitis, unspecified organism [J20.9]  [facility] 80kg Peritoneal Dialysis CCPD 8 hours 4 exchanges 2713m  fills   1. End stage renal disease: with hyperkalemia -Due to concerns of ineffective PD treatments, will place patient on hemodialysis  -02/07/21, Right IJ permcath - torsemide daily  - Continue holding telmisartan  -Discussed the reasons and precautions taken to manage hypotension during dialysis.     2.  Anemia of chronic kidney disease:  Lab Results  Component Value Date   HGB 8.2 (L) 02/10/2021    - EPO and venofer as outpatient  3. Secondary Hyperparathyroidism: with hypocalcemia. Corrected calcium of 9.2. Not currently on binders.   4.Malnutrition: albumin of 1.6 - cause for patient's hypotension and peripheral edema.  -Albumin  1.8 (02/07/21)    LOS: 4 Shantelle Breeze 2/21/20221:19 PM

## 2021-02-10 NOTE — Progress Notes (Signed)
Working on referral to [employer] for Outpatient dialysis chair time

## 2021-02-10 NOTE — Progress Notes (Signed)
   02/10/21 1028 02/10/21 1045 02/10/21 1100  Vitals  Temp 98.2 F (36.8 C)  --   --   Temp Source Oral  --   --   BP 127/88 (!) 100/50 98/62  MAP (mmHg) 101 (!) 64 67  BP Location Left Arm  --   --   BP Method Automatic  --   --   Patient Position (if appropriate) Lying  --   --   Pulse Rate  --  69 77  Pulse Rate Source Monitor  --   --   ECG Heart Rate  --  67 78  Resp  --  (!) 27 (!) 23  During Hemodialysis Assessment  Blood Flow Rate (mL/min) 250 mL/min 250 mL/min 250 mL/min  Arterial Pressure (mmHg) -70 mmHg -70 mmHg -70 mmHg  Venous Pressure (mmHg) 60 mmHg 60 mmHg 60 mmHg  Transmembrane Pressure (mmHg) 60 mmHg 60 mmHg 60 mmHg  Ultrafiltration Rate (mL/min) 200 mL/min 200 mL/min 200 mL/min  Dialysate Flow Rate (mL/min) 500 ml/min 500 ml/min 500 ml/min  Conductivity: Machine  '14 14 14  '$ HD Safety Checks Performed Yes Yes Yes  Dialysis Fluid Bolus Normal Saline  --   --   Bolus Amount (mL) 250 mL  --   --   Intra-Hemodialysis Comments Tx initiated Progressing as prescribed Progressing as prescribed    02/10/21 1115 02/10/21 1130 02/10/21 1145  Vitals  Temp  --   --   --   Temp Source  --   --   --   BP 117/78 (!) 109/59 119/61  MAP (mmHg) 90 75 79  BP Location  --   --   --   BP Method  --   --   --   Patient Position (if appropriate)  --   --   --   Pulse Rate 74 73 76  Pulse Rate Source  --   --   --   ECG Heart Rate 72 82 75  Resp (!) 30 (!) 29 (!) 22  During Hemodialysis Assessment  Blood Flow Rate (mL/min) 250 mL/min 250 mL/min 250 mL/min  Arterial Pressure (mmHg) -70 mmHg -100 mmHg -100 mmHg  Venous Pressure (mmHg) 60 mmHg 60 mmHg 60 mmHg  Transmembrane Pressure (mmHg) 60 mmHg 60 mmHg 60 mmHg  Ultrafiltration Rate (mL/min) 200 mL/min 200 mL/min 200 mL/min  Dialysate Flow Rate (mL/min) 500 ml/min 500 ml/min 500 ml/min  Conductivity: Machine  '14 14 14  '$ HD Safety Checks Performed Yes Yes Yes  Dialysis Fluid Bolus  --   --   --   Bolus Amount (mL)  --   --   --    Intra-Hemodialysis Comments Progressing as prescribed Progressing as prescribed Progressing as prescribed

## 2021-02-10 NOTE — Progress Notes (Signed)
   02/08/21 1430 02/08/21 1445 02/08/21 1500  Vitals  BP 90/60 (!) 110/51 114/62  MAP (mmHg) 71 69 76  Pulse Rate 72 79 76  ECG Heart Rate 78 86 70  Resp (!) 33 (!) 26 (!) 24  During Hemodialysis Assessment  Blood Flow Rate (mL/min) 200 mL/min 200 mL/min 200 mL/min  Arterial Pressure (mmHg) -70 mmHg -70 mmHg -70 mmHg  Venous Pressure (mmHg) 60 mmHg 60 mmHg 60 mmHg  Transmembrane Pressure (mmHg) 40 mmHg 40 mmHg 40 mmHg  Ultrafiltration Rate (mL/min) 250 mL/min 250 mL/min 250 mL/min  Dialysate Flow Rate (mL/min) 300 ml/min 300 ml/min 300 ml/min  Conductivity: Machine  13.9 13.9 13.9  HD Safety Checks Performed Yes Yes Yes  Dialysis Fluid Bolus Normal Saline  --   --   Bolus Amount (mL) 200 mL  --   --   Intra-Hemodialysis Comments Tx initiated Progressing as prescribed Progressing as prescribed    02/08/21 1515 02/08/21 1530 02/08/21 1545  Vitals  BP (!) 100/58 (!) 110/54 117/64  MAP (mmHg) 72 73 81  Pulse Rate 77 77 68  ECG Heart Rate 74 68 75  Resp (!) 25 (!) 26 (!) 23  During Hemodialysis Assessment  Blood Flow Rate (mL/min) 200 mL/min 200 mL/min 200 mL/min  Arterial Pressure (mmHg) -70 mmHg -70 mmHg -70 mmHg  Venous Pressure (mmHg) 60 mmHg 60 mmHg 60 mmHg  Transmembrane Pressure (mmHg) 30 mmHg 30 mmHg 30 mmHg  Ultrafiltration Rate (mL/min) 250 mL/min 250 mL/min 250 mL/min  Dialysate Flow Rate (mL/min) 300 ml/min 300 ml/min 300 ml/min  Conductivity: Machine  13.9 13.9 14  HD Safety Checks Performed Yes Yes Yes  Dialysis Fluid Bolus  --   --   --   Bolus Amount (mL)  --   --   --   Intra-Hemodialysis Comments Progressing as prescribed Progressing as prescribed Progressing as prescribed    02/08/21 1600 02/08/21 1604  Vitals  BP 99/62 (!) 106/47  MAP (mmHg) 73 66  Pulse Rate 66 66  ECG Heart Rate 69 69  Resp (!) 24 (!) 22  During Hemodialysis Assessment  Blood Flow Rate (mL/min) 200 mL/min 200 mL/min  Arterial Pressure (mmHg) -60 mmHg -60 mmHg  Venous Pressure (mmHg)  70 mmHg 60 mmHg  Transmembrane Pressure (mmHg) 30 mmHg 30 mmHg  Ultrafiltration Rate (mL/min) 250 mL/min 250 mL/min  Dialysate Flow Rate (mL/min) 300 ml/min 300 ml/min  Conductivity: Machine  13.8 13.8  HD Safety Checks Performed Yes Yes  Dialysis Fluid Bolus  --   --   Bolus Amount (mL)  --   --   Intra-Hemodialysis Comments Progressing as prescribed Progressing as prescribed

## 2021-02-10 NOTE — TOC Initial Note (Signed)
Transition of Care [facility]) - Initial/Assessment Note    Patient Details  Name: Wesley Henderson MRN: [id number] Date of Birth: May 22, 1930  [facility]) CM/SW Contact:    Kerin Salen, RN Phone Number: 02/10/2021, 3:40 PM  Clinical Narrative:  Spoke with patient, wife at bedside, both drive and assist with errands. Wife does most of shopping and cooking. 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 Attends health care appointments without difficulty. Agrees to [facility] PT, arrangements with [facility]. Patient with cane and walker however do not use much, they had it from previous hospitalization. No other DME in the home. Will continue to track for [facility] needs.             Expected Discharge Plan: [place] Barriers to Discharge: Continued Medical Work up   Patient Goals and CMS Choice Patient states their goals for this hospitalization and ongoing recovery are:: To return home with wife.   Choice offered to / list presented to : NA  Expected Discharge Plan and Services Expected Discharge Plan: [place] In-house Referral: Clinical Social Work Discharge Planning Services: NA Post Acute Care Choice: [place] arrangements for the past 2 months: [place] Expected Discharge Date: 02/10/21               DME Arranged: N/A DME Agency: NA       HH Arranged: PT HH Agency: [facility] Date [place]: 02/10/21 Time [place]: [place] Representative spoke with at [place]: [place] [phone]  Prior Living Arrangements/Services Living arrangements for the past 2 months: [place] with:: Spouse Patient language and need for interpreter reviewed:: Yes Do you feel safe going back to the place where you live?: Yes      Need for Family Participation in Patient Care: Yes (Comment) Care giver support system in place?: Yes (comment) Current home services: Home PT Criminal  Activity/Legal Involvement Pertinent to Current Situation/Hospitalization: No - Comment as needed  Activities of Daily Living Home Assistive Devices/Equipment: None ADL Screening (condition at time of admission) Patient's cognitive ability adequate to safely complete daily activities?: Yes Is the patient deaf or have difficulty hearing?: No Does the patient have difficulty seeing, even when wearing glasses/contacts?: No Does the patient have difficulty concentrating, remembering, or making decisions?: No Patient able to express need for assistance with ADLs?: Yes Does the patient have difficulty dressing or bathing?: Yes Independently performs ADLs?: Yes (appropriate for developmental age) Does the patient have difficulty walking or climbing stairs?: Yes Weakness of Legs: Both Weakness of Arms/Hands: None  Permission Sought/Granted Permission sought to share information with : Case Manager Permission granted to share information with : No              Emotional Assessment Appearance:: Appears stated age Attitude/Demeanor/Rapport: Engaged Affect (typically observed): Stable,Accepting Orientation: : Oriented to Self,Oriented to Place,Oriented to  Time Alcohol / Substance Use: Not Applicable Psych Involvement: No (comment)  Admission diagnosis:  Hyperkalemia [E87.5] Generalized weakness [R53.1] Acute bronchitis, unspecified organism [J20.9] Patient Active Problem List   Diagnosis Date Noted  . Acute congestive heart failure ([place])   . Wheeze   . End stage renal disease ([place])   . Weakness   . Hyperkalemia 02/05/2021  . Chronic kidney disease 02/14/2018  . Essential hypertension 02/14/2018  . Hyperlipidemia 02/14/2018  . Coronary artery disease 02/14/2018  . Diabetes mellitus with ESRD (end-stage renal disease) ([place]) 06/11/2016  .  Anemia of chronic disease 06/11/2016  . Proteinuria 08/30/2014   PCP:  [name], MD Pharmacy:   [facility] [address], [place]  - [place] [address] [place] [address] Phone: [phone] Fax: [phone]     Social Determinants of Health (SDOH) Interventions    Readmission Risk Interventions No flowsheet data found.

## 2021-02-10 NOTE — Care Management Important Message (Signed)
Important Message  Patient Details  Name: CLAUDE KIL MRN: [id number] Date of Birth: 03-Dec-1930   Medicare Important Message Given:  Yes     Dannette Barbara 02/10/2021, 11:39 AM

## 2021-02-10 NOTE — Discharge Summary (Signed)
Wesley Henderson at [place] NAME: Wesley Henderson    MR#:  [id number]  DATE OF BIRTH:  1930/10/23  DATE OF ADMISSION:  02/05/2021 ADMITTING PHYSICIAN: Wesley Grayer, MD  DATE OF DISCHARGE: 02/10/2021  4:43 PM  PRIMARY CARE PHYSICIAN: Wesley Hire, MD    ADMISSION DIAGNOSIS:  Hyperkalemia [E87.5] Generalized weakness [R53.1] Acute bronchitis, unspecified organism [J20.9]  DISCHARGE DIAGNOSIS:  Principal Problem:   Hyperkalemia Active Problems:   Chronic kidney disease   Essential hypertension   Diabetes mellitus with ESRD (end-stage renal disease) (HCC)   Anemia of chronic disease   Acute congestive heart failure (HCC)   Wheeze   End stage renal disease ([place])   Weakness   SECONDARY DIAGNOSIS:   Past Medical History:  Diagnosis Date  . Anemia   . Anxiety   . CHF (congestive heart failure) ([place])   . Chronic kidney disease   . Diabetes mellitus without complication ([place])    Pre-Diabetic  . Hyperlipidemia   . Hypertension     HOSPITAL COURSE:   1.  Acute diastolic congestive heart failure.  Patient did have cardiac wheeze.  The patient was converted from peritoneal dialysis to hemodialysis to better manage fluid since he does not urinate much.  He had 2 hemodialysis sessions in the hospital.  Patient on Coreg.  The patient was diuresed with IV Lasix while here in the hospital and switched over to torsemide upon disposition.  Echocardiogram showed an EF of 60 to 65% 2.  End-stage renal disease with hyperkalemia.  Patient's initial potassium was 6.4.  His outpatient potassium supplementation was stopped and he was started on Lokelma.  Potassium on discharge 3.7.  Peritoneal dialysis initially was started but switched over to hemodialysis.  Permacath placed.  Patient was set up with [facility] hemod[facility] at [facility] on Wednesday, Friday and Mondays. 3.  Weakness and falls.  He did well with physical therapy and they recommended  home with home health and that was set up. 4.  Essential hypertension continued Coreg.  Other antihypertensives were discontinued. 5.  Hyperlipidemia unspecified on simvastatin 6.  Type 2 diabetes mellitus with end-stage renal disease.  He last hemoglobin A1c 6.0 so no need for any medications. 7.  Renal cyst on sonogram 8.  Anemia of chronic disease last hemoglobin 8.2.  Ferritin elevated at 1047 9.  Secondary hyperparathyroidism 10.  Low albumin 11.  Patient was also given an albuterol inhaler and ipratropium nasal spray to see if that will help out with his congestion.  DISCHARGE CONDITIONS:   Satisfactory  CONSULTS OBTAINED:  Nephrology Vascular surgery  DRUG ALLERGIES:   Allergies  Allergen Reactions  . Hydralazine Swelling    Swelling hands feet and ankles    DISCHARGE MEDICATIONS:   Allergies as of 02/10/2021      Reactions   Hydralazine Swelling   Swelling hands feet and ankles      Medication List    STOP taking these medications   amLODipine 10 MG tablet Commonly known as: NORVASC   furosemide 80 MG tablet Commonly known as: LASIX   potassium chloride SA 20 MEQ tablet Commonly known as: KLOR-CON   telmisartan 80 MG tablet Commonly known as: MICARDIS     TAKE these medications   albuterol 108 (90 Base) MCG/ACT inhaler Commonly known as: VENTOLIN HFA Inhale 2 puffs into the lungs every 6 (six) hours as needed for wheezing or shortness of breath.   aspirin EC 81 MG tablet  Take 81 mg by mouth daily.   benzonatate 100 MG capsule Commonly known as: TESSALON Take 1 capsule (100 mg total) by mouth 3 (three) times daily as needed for cough.   bisacodyl 5 MG EC tablet Commonly known as: DULCOLAX Take 5 mg by mouth daily as needed for moderate constipation.   carvedilol 12.5 MG tablet Commonly known as: COREG Take 12.5 mg by mouth 2 (two) times daily with a meal.   guaiFENesin-dextromethorphan 100-10 MG/5ML syrup Commonly known as: ROBITUSSIN  DM Take 5 mLs by mouth every 4 (four) hours as needed for cough.   ipratropium 0.06 % nasal spray Commonly known as: ATROVENT Place 2 sprays into both nostrils 2 (two) times daily.   polyethylene glycol 17 g packet Commonly known as: MIRALAX / GLYCOLAX Take 17 g by mouth daily as needed.   psyllium 95 % Pack Commonly known as: HYDROCIL/METAMUCIL Take 1 packet by mouth daily.   simvastatin 20 MG tablet Commonly known as: ZOCOR Take 20 mg by mouth at bedtime.   Torsemide 40 MG Tabs Take 40 mg by mouth daily. Start taking on: February 11, 2021        DISCHARGE INSTRUCTIONS:   Follow-up PMD 5 days Follow-up dialysis Monday Wednesday Friday  If you experience worsening of your admission symptoms, develop shortness of breath, life threatening emergency, suicidal or homicidal thoughts you must seek medical attention immediately by calling 911 or calling your MD immediately  if symptoms less severe.  You Must read complete instructions/literature along with all the possible adverse reactions/side effects for all the Medicines you take and that have been prescribed to you. Take any new Medicines after you have completely understood and accept all the possible adverse reactions/side effects.   Please note  You were cared for by a hospitalist during your hospital stay. If you have any questions about your discharge medications or the care you received while you were in the hospital after you are discharged, you can call the unit and asked to speak with the hospitalist on call if the hospitalist that took care of you is not available. Once you are discharged, your primary care physician will handle any further medical issues. Please note that NO REFILLS for any discharge medications will be authorized once you are discharged, as it is imperative that you return to your primary care physician (or establish a relationship with a primary care physician if you do not have one) for your  aftercare needs so that they can reassess your need for medications and monitor your lab values.    Today   CHIEF COMPLAINT:   Chief Complaint  Patient presents with  . Weakness    HISTORY OF PRESENT ILLNESS:  Wesley Henderson  is a 87 y.o. male initially came in with weakness and hyperkalemia   VITAL SIGNS:  Blood pressure (!) 138/56, pulse 77, temperature 98 F (36.7 C), temperature source Oral, resp. rate 18, height '5\' 8"'$  (1.727 m), weight 82.3 kg, SpO2 96 %.  I/O:    Intake/Output Summary (Last 24 hours) at 02/10/2021 1736 Last data filed at 02/10/2021 1300 Gross per 24 hour  Intake 480 ml  Output 0 ml  Net 480 ml    PHYSICAL EXAMINATION:  GENERAL:  87 y.o.-year-old patient lying in the bed with no acute distress.  EYES: Pupils equal, round, reactive to light and accommodation. No scleral icterus. Extraocular muscles intact.  HEENT: Head atraumatic, normocephalic. Oropharynx and nasopharynx clear. .  LUNGS: After coughing normal breath sounds  bilaterally, no wheezing, rales,rhonchi or crepitation. No use of accessory muscles of respiration.  CARDIOVASCULAR: S1, S2 normal. No murmurs, rubs, or gallops.  ABDOMEN: Soft, non-tender, non-distended.  EXTREMITIES: No pedal edema.  NEUROLOGIC: Cranial nerves II through XII are intact. Muscle strength 5/5 in all extremities. Sensation intact. Gait not checked.  PSYCHIATRIC: The patient is alert and oriented x 3.  SKIN: No obvious rash, lesion, or ulcer.   DATA REVIEW:   CBC Recent Labs  Lab 02/10/21 0457  WBC 8.1  HGB 8.2*  HCT 23.8*  PLT 294    Chemistries  Recent Labs  Lab 02/05/21 1107 02/05/21 1509 02/10/21 0457  NA  --    < > 133*  K  --    < > 3.7  CL  --    < > 98  CO2  --    < > 27  GLUCOSE  --    < > 119*  BUN  --    < > 44*  CREATININE  --    < > 4.75*  CALCIUM  --    < > 6.8*  AST 27  --   --   ALT 18  --   --   ALKPHOS 76  --   --   BILITOT 0.7  --   --    < > = values in this interval not  displayed.     Microbiology Results  Results for orders placed or performed during the hospital encounter of 02/05/21  Resp Panel by RT-PCR (Flu A&B, Covid) Nasopharyngeal Swab     Status: None   Collection Time: 02/05/21 11:07 AM   Specimen: Nasopharyngeal Swab; Nasopharyngeal(NP) swabs in vial transport medium  Result Value Ref Range Status   SARS Coronavirus 2 by RT PCR NEGATIVE NEGATIVE Final    Comment: (NOTE) SARS-CoV-2 target nucleic acids are NOT DETECTED.  The SARS-CoV-2 RNA is generally detectable in upper respiratory specimens during the acute phase of infection. The lowest concentration of SARS-CoV-2 viral copies this assay can detect is 138 copies/mL. A negative result does not preclude SARS-Cov-2 infection and should not be used as the sole basis for treatment or other patient management decisions. A negative result may occur with  improper specimen collection/handling, submission of specimen other than nasopharyngeal swab, presence of viral mutation(s) within the areas targeted by this assay, and inadequate number of viral copies(<138 copies/mL). A negative result must be combined with clinical observations, patient history, and epidemiological information. The expected result is Negative.  Fact Sheet for Patients:  EntrepreneurPulse.com.au  Fact Sheet for Healthcare Providers:  IncredibleEmployment.be  This test is no t yet approved or cleared by the [place] FDA and  has been authorized for detection and/or diagnosis of SARS-CoV-2 by FDA under an Emergency Use Authorization (EUA). This EUA will remain  in effect (meaning this test can be used) for the duration of the COVID-19 declaration under Section 564(b)(1) of the Act, 21 U.S.C.section 360bbb-3(b)(1), unless the authorization is terminated  or revoked sooner.       Influenza A by PCR NEGATIVE NEGATIVE Final   Influenza B by PCR NEGATIVE NEGATIVE Final     Comment: (NOTE) The Xpert Xpress SARS-CoV-2/FLU/RSV plus assay is intended as an aid in the diagnosis of influenza from Nasopharyngeal swab specimens and should not be used as a sole basis for treatment. Nasal washings and aspirates are unacceptable for Xpert Xpress SARS-CoV-2/FLU/RSV testing.  Fact Sheet for Patients: EntrepreneurPulse.com.au  Fact Sheet for Healthcare Providers: IncredibleEmployment.be  This test is not yet approved or cleared by the [place] and has been authorized for detection and/or diagnosis of SARS-CoV-2 by FDA under an Emergency Use Authorization (EUA). This EUA will remain in effect (meaning this test can be used) for the duration of the COVID-19 declaration under Section 564(b)(1) of the Act, 21 U.S.C. section 360bbb-3(b)(1), unless the authorization is terminated or revoked.  Performed at [facility], [address] [place],  [address]       Management plans discussed with the patient, family and they are in agreement.  CODE STATUS:     Code Status Orders  (From admission, onward)         Start     Ordered   02/05/21 1422  Full code  Continuous        02/05/21 1423        Code Status History    This patient has a current code status but no historical code status.   Advance Care Planning Activity      TOTAL TIME TAKING CARE OF THIS PATIENT: 35 minutes.    Wesley Henderson M.D on 02/10/2021 at 5:36 PM  Between 7am to 6pm - Pager - [phone]  After 6pm go to [email or web address] - password [email or web address]  [facility]  CC: Primary care physician; Wesley Hire, MD

## 2021-02-10 NOTE — Discharge Instructions (Signed)
Wesley Henderson raven 11:30 Wednesday, Friday, Monday

## 2021-03-27 IMAGING — DX DG CHEST 1V PORT
1 series · 1 of 1 positions shown · non-contrast
Comparison: None.

CLINICAL DATA: Shortness of breath, wheezing

EXAM:
PORTABLE CHEST 1 VIEW

[chest ap]
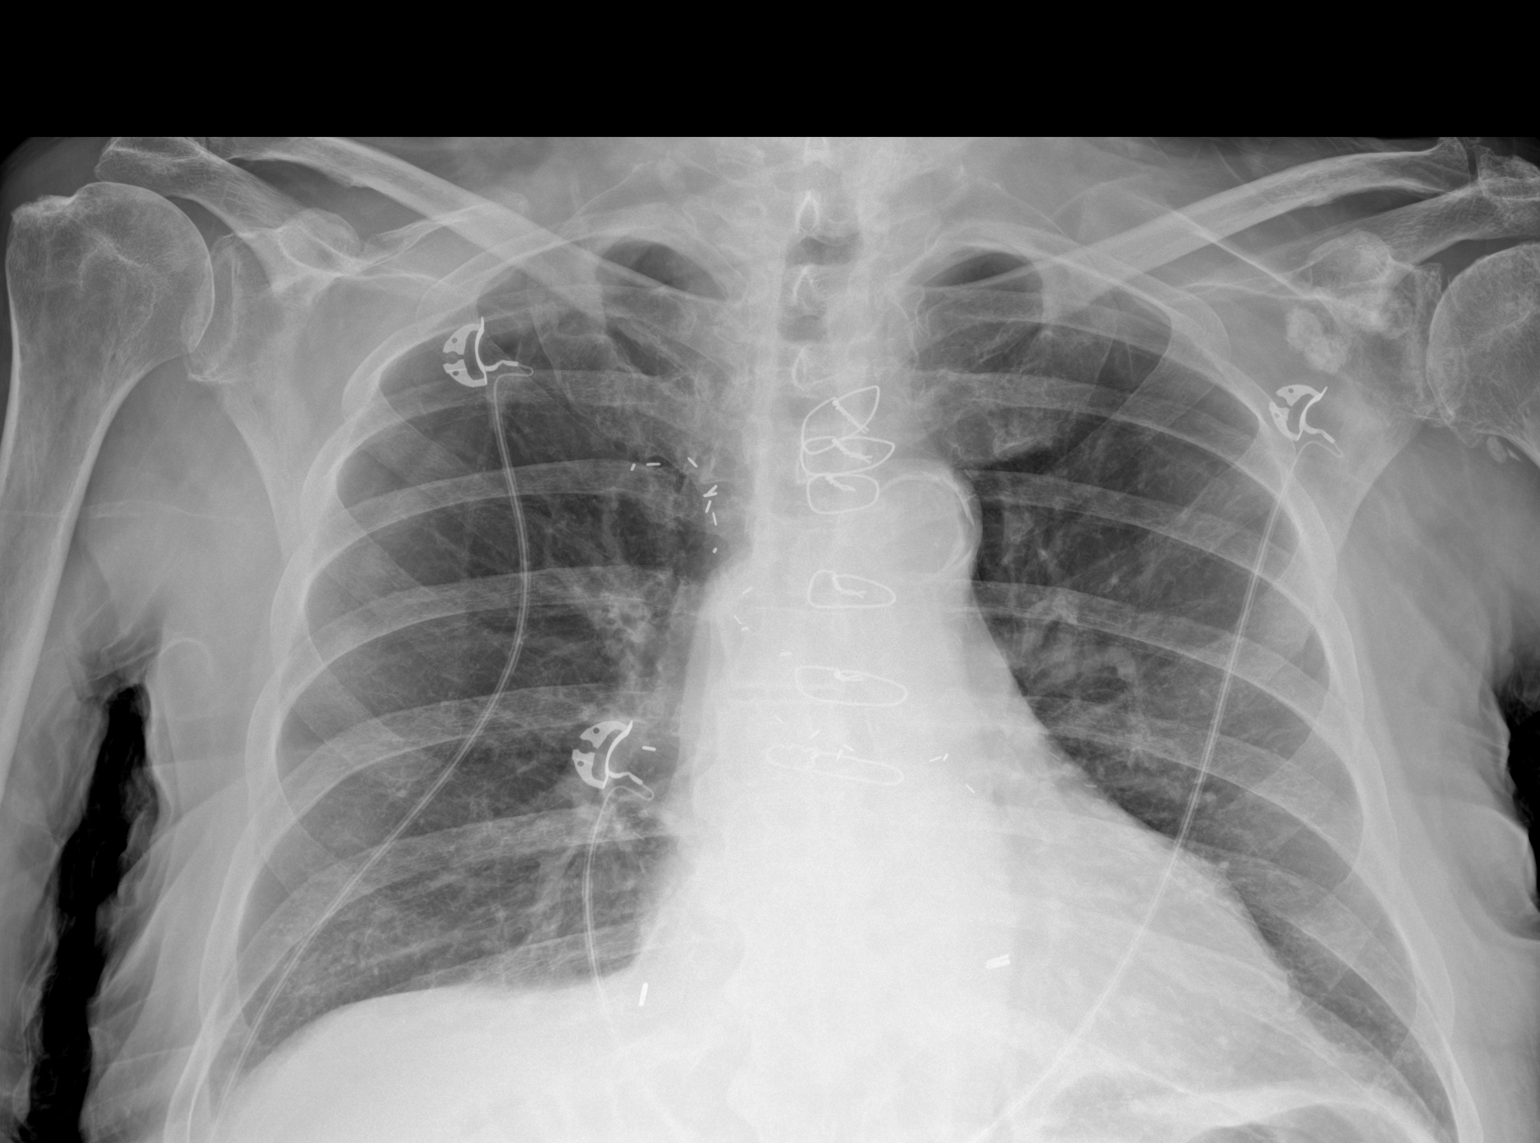

[1 of 1 positions shown; findings below may reference images not displayed]

FINDINGS: Prior CABG. Heart is normal size. Aortic atherosclerosis. No
confluent opacities or effusions. No acute bony abnormality.
IMPRESSION: No active disease.

## 2021-04-14 HISTORY — PX: CAPD REVISION: SHX5260

## 2021-04-21 DIAGNOSIS — N186 End stage renal disease: Secondary | ICD-10-CM | POA: Insufficient documentation

## 2021-04-21 DIAGNOSIS — E1122 Type 2 diabetes mellitus with diabetic chronic kidney disease: Secondary | ICD-10-CM | POA: Insufficient documentation

## 2021-04-22 ENCOUNTER — Ambulatory Visit
Admission: RE | Admit: 2021-04-22 | Discharge: 2021-04-22 | Disposition: A | Discharge status: Home / Self Care | Payer: Medicare Other | Source: Ambulatory Visit | Attending: Nephrology | Admitting: Nephrology

## 2021-04-22 ENCOUNTER — Ambulatory Visit
Admission: RE | Admit: 2021-04-22 | Discharge: 2021-04-22 | Disposition: A | Discharge status: Home / Self Care | Payer: Medicare Other | Attending: Nephrology | Admitting: Nephrology

## 2021-04-22 ENCOUNTER — Other Ambulatory Visit: Payer: Self-pay

## 2021-04-22 ENCOUNTER — Other Ambulatory Visit: Payer: Self-pay | Admitting: Nephrology

## 2021-04-22 DIAGNOSIS — T85611A Breakdown (mechanical) of intraperitoneal dialysis catheter, initial encounter: Secondary | ICD-10-CM

## 2021-05-21 HISTORY — PX: PERITONEAL CATHETER REMOVAL: SUR1106

## 2021-06-25 ENCOUNTER — Other Ambulatory Visit (INDEPENDENT_AMBULATORY_CARE_PROVIDER_SITE_OTHER): Payer: Self-pay | Admitting: Vascular Surgery

## 2021-06-25 DIAGNOSIS — N186 End stage renal disease: Secondary | ICD-10-CM

## 2021-06-26 ENCOUNTER — Ambulatory Visit (INDEPENDENT_AMBULATORY_CARE_PROVIDER_SITE_OTHER): Payer: Medicare Other

## 2021-06-26 ENCOUNTER — Other Ambulatory Visit: Payer: Self-pay

## 2021-06-26 ENCOUNTER — Encounter (INDEPENDENT_AMBULATORY_CARE_PROVIDER_SITE_OTHER): Payer: Self-pay | Admitting: Nurse Practitioner

## 2021-06-26 ENCOUNTER — Ambulatory Visit (INDEPENDENT_AMBULATORY_CARE_PROVIDER_SITE_OTHER): Payer: Medicare Other | Admitting: Nurse Practitioner

## 2021-06-26 VITALS — BP 211/94 | HR 69 | Resp 16 | Ht 68.0 in | Wt 154.0 lb

## 2021-06-26 DIAGNOSIS — E785 Hyperlipidemia, unspecified: Secondary | ICD-10-CM

## 2021-06-26 DIAGNOSIS — N186 End stage renal disease: Secondary | ICD-10-CM

## 2021-06-26 DIAGNOSIS — E1122 Type 2 diabetes mellitus with diabetic chronic kidney disease: Secondary | ICD-10-CM

## 2021-06-26 DIAGNOSIS — I10 Essential (primary) hypertension: Secondary | ICD-10-CM | POA: Diagnosis not present

## 2021-06-26 NOTE — Progress Notes (Signed)
Subjective:    Patient ID: Wesley Henderson, male    DOB: 25-Nov-1930, 87 y.o.   MRN: [id number] Chief Complaint  Patient presents with   Follow-up    ultrasound    Nissim Lias is an 87 year old male that presents today for evaluation of permanent dialysis access.  The patient was previously on peritoneal dialysis however after difficulties his peritoneal dialysis catheter has been removed and he is now progressing to hemodialysis.  He is currently maintained via a right chest PermCath.  The patient is right-handed.  The patient has been considering the various methods of dialysis and wishes to proceed with hemodialysis and therefore creation of AV access.  The patient denies amaurosis fugax or recent TIA symptoms. There are no recent neurological changes noted. The patient denies claudication symptoms or rest pain symptoms. The patient denies history of DVT, PE or superficial thrombophlebitis. The patient denies recent episodes of angina or shortness of breath.  Today noninvasive studies show adequate access for creation of a   Review of Systems  Cardiovascular:  Negative for leg swelling.  All other systems reviewed and are negative.     Objective:   Physical Exam Vitals reviewed.  HENT:     Head: Normocephalic.  Cardiovascular:     Rate and Rhythm: Normal rate.     Pulses:          Radial pulses are 2+ on the right side and 2+ on the left side.  Pulmonary:     Effort: Pulmonary effort is normal.  Skin:    General: Skin is warm and dry.  Neurological:     Mental Status: He is alert and oriented to person, place, and time. Mental status is at baseline.  Psychiatric:        Mood and Affect: Mood normal.        Behavior: Behavior normal.        Thought Content: Thought content normal.        Judgment: Judgment normal.    BP (!) 211/94 (BP Location: Right Arm)   Pulse 69   Resp 16   Ht '5\' 8"'$  (1.727 m)   Wt 154 lb (69.9 kg)   BMI 23.42 kg/m   Past Medical  History:  Diagnosis Date   Anemia    Anxiety    CHF (congestive heart failure) (HCC)    Chronic kidney disease    Diabetes mellitus without complication (HCC)    Pre-Diabetic   Hyperlipidemia    Hypertension     Social History   Socioeconomic History   Marital status: Married    Spouse name: Not on file   Number of children: Not on file   Years of education: Not on file   Highest education level: Not on file  Occupational History   Not on file  Tobacco Use   Smoking status: Never   Smokeless tobacco: Never  Vaping Use   Vaping Use: Never used  Substance and Sexual Activity   Alcohol use: No   Drug use: No   Sexual activity: Not on file  Other Topics Concern   Not on file  Social History Narrative   Not on file   Social Determinants of Health   Financial Resource Strain: Not on file  Food Insecurity: Not on file  Transportation Needs: Not on file  Physical Activity: Not on file  Stress: Not on file  Social Connections: Not on file  Intimate Partner Violence: Not on file    Past  Surgical History:  Procedure Laterality Date   CAPD INSERTION N/A 03/04/2018   Procedure: LAPAROSCOPIC INSERTION CONTINUOUS AMBULATORY PERITONEAL DIALYSIS  (CAPD) CATHETER;  Surgeon: Katha Cabal, MD;  Location: [facility]S;  Service: Vascular;  Laterality: N/A;   CAPD REVISION N/A 04/06/2018   Procedure: LAPAROSCOPIC REVISION CONTINUOUS AMBULATORY PERITONEAL DIALYSIS  (CAPD) CATHETER;  Surgeon: Katha Cabal, MD;  Location: [facility];  Service: Vascular;  Laterality: N/A;   CORONARY ARTERY BYPASS GRAFT     Oct 1996   DIALYSIS/PERMA CATHETER INSERTION N/A 02/07/2021   Procedure: DIALYSIS/PERMA CATHETER INSERTION;  Surgeon: Katha Cabal, MD;  Location: [facility];  Service: Cardiovascular;  Laterality: N/A;   heart bypass     PORTA CATH REMOVAL      Family History  Problem Relation Age of Onset   Other Mother    Hypertension Mother     Allergies  Allergen  Reactions   Hydralazine Swelling    Swelling hands feet and ankles    CBC Latest Ref Rng & Units 02/10/2021 02/06/2021 02/05/2021  WBC 4.0 - 10.5 K/uL 8.1 9.2 14.1(H)  Hemoglobin 13.0 - 17.0 g/dL 8.2(L) 8.6(L) 8.6(L)  Hematocrit 39.0 - 52.0 % 23.8(L) 25.4(L) 26.2(L)  Platelets 150 - 400 K/uL 294 313 322      CMP     Component Value Date/Time   NA 133 (L) 02/10/2021 0457   K 3.7 02/10/2021 0457   CL 98 02/10/2021 0457   CO2 27 02/10/2021 0457   GLUCOSE 119 (H) 02/10/2021 0457   BUN 44 (H) 02/10/2021 0457   CREATININE 4.75 (H) 02/10/2021 0457   CALCIUM 6.8 (L) 02/10/2021 0457   PROT 5.0 (L) 02/05/2021 1107   ALBUMIN 1.8 (L) 02/07/2021 1240   AST 27 02/05/2021 1107   ALT 18 02/05/2021 1107   ALKPHOS 76 02/05/2021 1107   BILITOT 0.7 02/05/2021 1107   GFRNONAA 11 (L) 02/10/2021 0457   GFRAA 9 (L) 04/01/2018 1415     No results found.     Assessment & Plan:   1. ESRD (end stage renal disease) ([place]) Recommend:  At this time the patient does not have appropriate extremity access for dialysis  Patient should have a left brachiocephalic AV fistula created.  It the veins appear to be sclerotic but are smaller than measured, and a brachial axillary graft Will be utilized instead.  The risks, benefits and alternative therapies were reviewed in detail with the patient.  All questions were answered.  The patient agrees to proceed with surgery.    2. Hyperlipidemia, unspecified hyperlipidemia type Continue statin as ordered and reviewed, no changes at this time   3. Essential hypertension Continue antihypertensive medications as already ordered, these medications have been reviewed and there are no changes at this time.  Patient's blood pressure was very elevated today advised her to check at home.  If it is still elevated he should seek emergent care.  Currently the patient is asymptomatic.  4. Diabetes mellitus with ESRD (end-stage renal disease) ([place]) Continue hypoglycemic  medications as already ordered, these medications have been reviewed and there are no changes at this time.  Hgb A1C to be monitored as already arranged by primary service    Current Outpatient Medications on File Prior to Visit  Medication Sig Dispense Refill   albuterol (VENTOLIN HFA) 108 (90 Base) MCG/ACT inhaler Inhale 2 puffs into the lungs every 6 (six) hours as needed for wheezing or shortness of breath. 8 g 0   aspirin  EC 81 MG tablet Take 81 mg by mouth daily.     bisacodyl (DULCOLAX) 5 MG EC tablet Take 5 mg by mouth daily as needed for moderate constipation.     carvedilol (COREG) 12.5 MG tablet Take 12.5 mg by mouth 2 (two) times daily with a meal.      polyethylene glycol (MIRALAX / GLYCOLAX) 17 g packet Take 17 g by mouth daily as needed. 30 each 0   psyllium (HYDROCIL/METAMUCIL) 95 % PACK Take 1 packet by mouth daily.     simvastatin (ZOCOR) 20 MG tablet Take 20 mg by mouth at bedtime.      torsemide 40 MG TABS Take 40 mg by mouth daily. 30 tablet 0   benzonatate (TESSALON) 100 MG capsule Take 1 capsule (100 mg total) by mouth 3 (three) times daily as needed for cough. (Patient not taking: Reported on 06/26/2021) 20 capsule 0   guaiFENesin-dextromethorphan (ROBITUSSIN DM) 100-10 MG/5ML syrup Take 5 mLs by mouth every 4 (four) hours as needed for cough. (Patient not taking: Reported on 06/26/2021) 118 mL 0   ipratropium (ATROVENT) 0.06 % nasal spray Place 2 sprays into both nostrils 2 (two) times daily. (Patient not taking: Reported on 06/26/2021) 15 mL 1   No current facility-administered medications on file prior to visit.    There are no Patient Instructions on file for this visit. No follow-ups on file.   Kris Hartmann, NP

## 2021-06-27 ENCOUNTER — Encounter (INDEPENDENT_AMBULATORY_CARE_PROVIDER_SITE_OTHER): Payer: Medicare Other | Admitting: Nurse Practitioner

## 2021-06-27 ENCOUNTER — Encounter (INDEPENDENT_AMBULATORY_CARE_PROVIDER_SITE_OTHER): Payer: Medicare Other

## 2021-07-03 ENCOUNTER — Telehealth (INDEPENDENT_AMBULATORY_CARE_PROVIDER_SITE_OTHER): Payer: Self-pay

## 2021-07-03 NOTE — Telephone Encounter (Signed)
Spoke with the patients spouse and he is scheduled with Dr. Delana Meyer on 08/06/21 for a left brachiocephalic AV fistula at the [facility]. Pre-op phone call on 07/29/21 between 8-1 pm. Pre-surgical instructions were discussed and will be mailed.

## 2021-07-24 ENCOUNTER — Other Ambulatory Visit: Payer: Self-pay

## 2021-07-24 ENCOUNTER — Other Ambulatory Visit (INDEPENDENT_AMBULATORY_CARE_PROVIDER_SITE_OTHER): Payer: Self-pay | Admitting: Nurse Practitioner

## 2021-07-24 ENCOUNTER — Encounter
Admission: RE | Admit: 2021-07-24 | Discharge: 2021-07-24 | Disposition: A | Discharge status: Home / Self Care | Payer: Medicare Other | Source: Ambulatory Visit | Attending: Vascular Surgery | Admitting: Vascular Surgery

## 2021-07-24 NOTE — Patient Instructions (Addendum)
Your procedure is scheduled on: Wednesday, August 17 Report to the Registration Desk on the 1st floor of the [facility]. To find out your arrival time, please call [phone] between 1PM - 3PM on: Tuesday, August 16  REMEMBER: Instructions that are not followed completely may result in serious medical risk, up to and including death; or upon the discretion of your surgeon and anesthesiologist your surgery may need to be rescheduled.  Do not eat food after midnight the night before surgery.  No gum chewing, lozengers or hard candies.  You may however, drink water up to 2 hours before you are scheduled to arrive for your surgery. Do not drink anything within 2 hours of your scheduled arrival time.  TAKE THESE MEDICATIONS THE MORNING OF SURGERY WITH A SIP OF WATER:  Albuterol nebulizer Carvedilol  Use inhalers on the day of surgery and bring to the hospital.  Follow recommendations from Cardiologist, Pulmonologist or PCP regarding stopping Aspirin. Continue taking the aspirin.  One week prior to surgery: starting August 10 Stop Anti-inflammatories (NSAIDS) such as Advil, Aleve, Ibuprofen, Motrin, Naproxen, Naprosyn and Aspirin based products such as Excedrin, Goodys Powder, BC Powder. Stop ANY OVER THE COUNTER supplements until after surgery. You may however, continue to take Tylenol if needed for pain up until the day of surgery.  No Alcohol for 24 hours before or after surgery.  On the morning of surgery brush your teeth with toothpaste and water, you may rinse your mouth with mouthwash if you wish. Do not swallow any toothpaste or mouthwash.  Do not wear jewelry.  Do not wear lotions, powders, or perfumes.   Do not shave body from the neck down 48 hours prior to surgery just in case you cut yourself which could leave a site for infection.  Also, freshly shaved skin may become irritated if using the CHG soap.  Contact lenses, hearing aids and dentures may not be worn into  surgery.  Do not bring valuables to the hospital. [facility] is not responsible for any missing/lost belongings or valuables.   Use CHG Soap as directed on instruction sheet.  Notify your doctor if there is any change in your medical condition (cold, fever, infection).  Wear comfortable clothing (specific to your surgery type) to the hospital.  After surgery, you can help prevent lung complications by doing breathing exercises.  Take deep breaths and cough every 1-2 hours. Your doctor may order a device called an Incentive Spirometer to help you take deep breaths.  If you are being discharged the day of surgery, you will not be allowed to drive home. You will need a responsible adult (18 years or older) to drive you home and stay with you that night.   If you are taking public transportation, you will need to have a responsible adult (18 years or older) with you. Please confirm with your physician that it is acceptable to use public transportation.   Please call the [facility] at [phone] if you have any questions about these instructions.  Surgery Visitation Policy:  Patients undergoing a surgery or procedure may have one family member or support person with them as long as that person is not COVID-19 positive or experiencing its symptoms.  That person may remain in the waiting area during the procedure.

## 2021-07-28 ENCOUNTER — Encounter: Payer: Self-pay | Admitting: Urgent Care

## 2021-07-28 ENCOUNTER — Encounter
Admission: RE | Admit: 2021-07-28 | Discharge: 2021-07-28 | Disposition: A | Discharge status: Home / Self Care | Payer: Medicare Other | Source: Ambulatory Visit | Attending: Vascular Surgery | Admitting: Vascular Surgery

## 2021-07-28 ENCOUNTER — Other Ambulatory Visit: Payer: Self-pay

## 2021-07-28 DIAGNOSIS — Z7982 Long term (current) use of aspirin: Secondary | ICD-10-CM | POA: Diagnosis not present

## 2021-07-28 DIAGNOSIS — I2510 Atherosclerotic heart disease of native coronary artery without angina pectoris: Secondary | ICD-10-CM | POA: Diagnosis not present

## 2021-07-28 DIAGNOSIS — Z951 Presence of aortocoronary bypass graft: Secondary | ICD-10-CM | POA: Insufficient documentation

## 2021-07-28 DIAGNOSIS — I4892 Unspecified atrial flutter: Secondary | ICD-10-CM | POA: Insufficient documentation

## 2021-07-28 DIAGNOSIS — I5030 Unspecified diastolic (congestive) heart failure: Secondary | ICD-10-CM | POA: Insufficient documentation

## 2021-07-28 DIAGNOSIS — Z01818 Encounter for other preprocedural examination: Secondary | ICD-10-CM | POA: Insufficient documentation

## 2021-07-28 DIAGNOSIS — I4439 Other atrioventricular block: Secondary | ICD-10-CM | POA: Insufficient documentation

## 2021-07-28 DIAGNOSIS — I4510 Unspecified right bundle-branch block: Secondary | ICD-10-CM | POA: Diagnosis not present

## 2021-07-28 DIAGNOSIS — Z79899 Other long term (current) drug therapy: Secondary | ICD-10-CM | POA: Diagnosis not present

## 2021-07-28 HISTORY — DX: Anemia in other chronic diseases classified elsewhere: D63.8

## 2021-07-28 HISTORY — DX: Type 2 diabetes mellitus with diabetic nephropathy: E11.21

## 2021-07-28 HISTORY — DX: Dependence on renal dialysis: N18.6

## 2021-07-28 HISTORY — DX: Secondary hyperparathyroidism of renal origin: N25.81

## 2021-07-28 HISTORY — DX: Atherosclerotic heart disease of native coronary artery without angina pectoris: I25.10

## 2021-07-28 LAB — TYPE AND SCREEN
ABO/RH(D): O NEG
Antibody Screen: NEGATIVE

## 2021-07-28 LAB — CBC WITH DIFFERENTIAL/PLATELET
Abs Immature Granulocytes: 0.04 10*3/uL (ref 0.00–0.07)
Basophils Absolute: 0 10*3/uL (ref 0.0–0.1)
Basophils Relative: 1 %
Eosinophils Absolute: 0.5 10*3/uL (ref 0.0–0.5)
Eosinophils Relative: 7 %
HCT: 32.6 % — ABNORMAL LOW (ref 39.0–52.0)
Hemoglobin: 11.1 g/dL — ABNORMAL LOW (ref 13.0–17.0)
Immature Granulocytes: 1 %
Lymphocytes Relative: 23 %
Lymphs Abs: 1.7 10*3/uL (ref 0.7–4.0)
MCH: 34.9 pg — ABNORMAL HIGH (ref 26.0–34.0)
MCHC: 34 g/dL (ref 30.0–36.0)
MCV: 102.5 fL — ABNORMAL HIGH (ref 80.0–100.0)
Monocytes Absolute: 0.6 10*3/uL (ref 0.1–1.0)
Monocytes Relative: 9 %
Neutro Abs: 4.5 10*3/uL (ref 1.7–7.7)
Neutrophils Relative %: 59 %
Platelets: 192 10*3/uL (ref 150–400)
RBC: 3.18 MIL/uL — ABNORMAL LOW (ref 4.22–5.81)
RDW: 13.2 % (ref 11.5–15.5)
WBC: 7.5 10*3/uL (ref 4.0–10.5)
nRBC: 0 % (ref 0.0–0.2)

## 2021-07-28 LAB — BASIC METABOLIC PANEL
Anion gap: 6 (ref 5–15)
BUN: 56 mg/dL — ABNORMAL HIGH (ref 8–23)
CO2: 25 mmol/L (ref 22–32)
Calcium: 8.2 mg/dL — ABNORMAL LOW (ref 8.9–10.3)
Chloride: 99 mmol/L (ref 98–111)
Creatinine, Ser: 5.29 mg/dL — ABNORMAL HIGH (ref 0.61–1.24)
GFR, Estimated: 10 mL/min — ABNORMAL LOW (ref >60)
Glucose, Bld: 135 mg/dL — ABNORMAL HIGH (ref 70–99)
Potassium: 5.1 mmol/L (ref 3.5–5.1)
Sodium: 130 mmol/L — ABNORMAL LOW (ref 135–145)

## 2021-07-28 LAB — PROTIME-INR
INR: 1.2 (ref 0.8–1.2)
Prothrombin Time: 14.8 seconds (ref 11.4–15.2)

## 2021-07-28 LAB — APTT: aPTT: 31 seconds (ref 24–36)

## 2021-07-28 NOTE — Progress Notes (Signed)
  Perioperative Services Pre-Admission/Anesthesia Testing    Date: 07/28/21  Name: Wesley Henderson MRN:   [id number]  Re: ECG changes and need for preoperative cardiovascular clearance  Patient is scheduled for an AV fistula creation on 08/06/2021 with Dr. Hortencia Pilar, MD.  In preparation for his procedure, patient presented to the PAT clinic on the morning of 07/28/2021 labs and ECG.  In review of his ECG, patient noted to be in atrial flutter with variable AV block; RBBB present. Additionally, there are anterolateral T wave abnormalities. When compared to tracing performed on 02/05/2021, these appear to be acute findings. Patient had a TTE on 02/07/2021 that revealed a normal LVEF of 60 to 65% with no RWMAs. PMH significant for HFpEF and CAD with previous three-vessel CABG (1996). He is on hemodialysis (MWF).     Impression and Plan:  Wesley Henderson has been found to be in presumably new onset atrial flutter. Patient on daily low dose beta blocker and ASA therapy. He has a (+)cardiovascular history significant for HFpEF and CAD (s/p 3v CABG). He had recent TTE that was normal.   Patient contacted to discuss. Given his upcoming procedure and acute ECG changes, will refer to local cardiologist for evaluation and preoperative clearance. Patient reporting that he saw Dr. Ubaldo Glassing "many many years ago". Will send over referral to [facility] cardiology; practice to reach out to patient with appointment details. Patient aware that appointment availability and/or need for further cardiovascular testing may result in delay of his procedure, however no changes are being made to the OR schedule at this time. We will continue to follow-up on patient appointment to determine recommendations from cardiology regarding his upcoming procedure.  Note sent to patient's primary attending surgeon Delana Meyer, MD) and his APP Owens Shark, NP-C) to make them aware of acute ECG changes and need for the aforementioned  cardiology consult and clearance prior to proceeding with patient's procedure.  Honor Loh, MSN, APRN, FNP-C, CEN [facility]  Peri-operative Services Nurse Practitioner Phone: [phone] 07/28/21 9:59 AM  NOTE: This note has been prepared using Dragon dictation software. Despite my best ability to proofread, there is always the potential that unintentional transcriptional errors may still occur from this process.

## 2021-07-29 ENCOUNTER — Inpatient Hospital Stay: Admission: RE | Admit: 2021-07-29 | Payer: Medicare Other | Source: Ambulatory Visit

## 2021-08-05 MED ORDER — CEFAZOLIN SODIUM-DEXTROSE 1-4 GM/50ML-% IV SOLN
1.0000 g | INTRAVENOUS | Status: AC
  Administered 2021-08-06: 2 g via INTRAVENOUS

## 2021-08-05 NOTE — Progress Notes (Signed)
  Perioperative Services Pre-Admission/Anesthesia Testing    Date: 08/05/21  Name: Wesley Henderson MRN:   [id number]  Re: Presurgical cardiac clearance   Case: [id number] Date/Time: 08/06/21 0815   Procedure: ARTERIOVENOUS (AV) FISTULA CREATION (BRACHIALCEPHALIC) (Left)   Anesthesia type: General   Pre-op diagnosis: ESRD   Location: [facility] OR ROOM 07 / [place] [facility]S FOR ANESTHESIA GROUP   Surgeons: Wesley Cabal, MD     Patient is scheduled for the above procedure on 08/06/2021 with Dr. Hortencia Pilar, MD.  In preparation for his procedure, patient presented to the PAT clinic on 07/28/2021 for preoperative testing.  ECG performed in the clinic revealed presumably new onset atrial flutter with variable AV block at a rate of 83 bpm.  PMH significant for HFpEF and a three-vessel CABG.  Patient was referred to cardiology for further evaluation and preoperative clearance.  Patient was seen in consult by cardiology on 08/04/2021 Wesley Sox, MD); notes reviewed.  At the time of his clinic visit, patient doing well overall from a cardiovascular perspective.  He denied any episodes of chest pain, shortness breath, PND, orthopnea, significant peripheral edema, palpitations, vertiginous symptoms, or presyncope/syncope.  He, patient does not exert himself much, however he is still able to complete at least 4 METS of activity.  Blood pressure was markedly elevated at 190/96 on prescribed beta-blocker and diuretic therapies.  Patient on a statin for his HLD.  He is not diabetic.  ECG in the office revealed atrial fibrillation with RBBB. CHA2DS2-VASc Score = 4 (age x 2, HTN, vascular disease).  Patient not currently anticoagulated.  Cardiology with plans to start chronic anticoagulation following patient's upcoming vascular procedure.  Per cardiology, "overall patient is doing well, and there is no further work-up needed for him prior to undergoing this moderate risk procedure.  The atrial fibrillation/flutter does  not change his perioperative risk, though it may complicate his management to some degree.  Patient has had an overall MODERATE risk for MACE in the perioperative period, but there is no further work-up needed that was successfully mitigate this risk".  Impression and Plan:  Wesley Henderson has been referred for pre-anesthesia review and clearance prior to him undergoing the planned anesthetic and procedural courses. Available labs, pertinent testing, and imaging results were personally reviewed by me. This patient has been appropriately cleared by cardiology with an overall MODERATE risk of significant perioperative cardiovascular complications.  Based on clinical review performed today (08/05/21), barring any significant acute changes in the patient's overall condition, it is anticipated that he will be able to proceed with the planned surgical intervention. Any acute changes in clinical condition may necessitate his procedure being postponed and/or cancelled. Patient will meet with anesthesia team (MD and/or CRNA) on this day of his procedure for preoperative evaluation/assessment.   Pre-surgical instructions were reviewed with the patient during his PAT appointment and questions were fielded by PAT clinical staff. Patient was advised that if any questions or concerns arise prior to his procedure then he should return a call to PAT and/or his surgeon's office to discuss.  Wesley Loh, MSN, APRN, FNP-C, CEN [facility]  Peri-operative Services Nurse Practitioner Phone: [phone] 08/05/21 12:57 PM  NOTE: This note has been prepared using Dragon dictation software. Despite my best ability to proofread, there is always the potential that unintentional transcriptional errors may still occur from this process.

## 2021-08-06 ENCOUNTER — Ambulatory Visit: Payer: Medicare Other

## 2021-08-06 ENCOUNTER — Other Ambulatory Visit: Payer: Self-pay

## 2021-08-06 ENCOUNTER — Ambulatory Visit: Payer: Medicare Other | Admitting: Urgent Care

## 2021-08-06 ENCOUNTER — Encounter
Admission: RE | Disposition: A | Discharge status: Home / Self Care | Payer: Self-pay | Source: Home / Self Care | Attending: Vascular Surgery

## 2021-08-06 ENCOUNTER — Encounter: Payer: Self-pay | Admitting: Vascular Surgery

## 2021-08-06 ENCOUNTER — Ambulatory Visit
Admission: RE | Admit: 2021-08-06 | Discharge: 2021-08-06 | Disposition: A | Discharge status: Home / Self Care | Payer: Medicare Other | Attending: Vascular Surgery | Admitting: Vascular Surgery

## 2021-08-06 DIAGNOSIS — Z79899 Other long term (current) drug therapy: Secondary | ICD-10-CM | POA: Insufficient documentation

## 2021-08-06 DIAGNOSIS — E1122 Type 2 diabetes mellitus with diabetic chronic kidney disease: Secondary | ICD-10-CM | POA: Diagnosis not present

## 2021-08-06 DIAGNOSIS — Z992 Dependence on renal dialysis: Secondary | ICD-10-CM | POA: Insufficient documentation

## 2021-08-06 DIAGNOSIS — I132 Hypertensive heart and chronic kidney disease with heart failure and with stage 5 chronic kidney disease, or end stage renal disease: Secondary | ICD-10-CM | POA: Diagnosis not present

## 2021-08-06 DIAGNOSIS — Z87891 Personal history of nicotine dependence: Secondary | ICD-10-CM | POA: Diagnosis not present

## 2021-08-06 DIAGNOSIS — I509 Heart failure, unspecified: Secondary | ICD-10-CM | POA: Insufficient documentation

## 2021-08-06 DIAGNOSIS — E785 Hyperlipidemia, unspecified: Secondary | ICD-10-CM | POA: Insufficient documentation

## 2021-08-06 DIAGNOSIS — Z7982 Long term (current) use of aspirin: Secondary | ICD-10-CM | POA: Diagnosis not present

## 2021-08-06 DIAGNOSIS — Z419 Encounter for procedure for purposes other than remedying health state, unspecified: Secondary | ICD-10-CM

## 2021-08-06 DIAGNOSIS — Z888 Allergy status to other drugs, medicaments and biological substances status: Secondary | ICD-10-CM | POA: Diagnosis not present

## 2021-08-06 DIAGNOSIS — N186 End stage renal disease: Secondary | ICD-10-CM | POA: Insufficient documentation

## 2021-08-06 HISTORY — PX: AV FISTULA PLACEMENT: SHX1204

## 2021-08-06 LAB — POCT I-STAT, CHEM 8
BUN: 49 mg/dL — ABNORMAL HIGH (ref 8–23)
Calcium, Ion: 1.07 mmol/L — ABNORMAL LOW (ref 1.15–1.40)
Chloride: 99 mmol/L (ref 98–111)
Creatinine, Ser: 4.7 mg/dL — ABNORMAL HIGH (ref 0.61–1.24)
Glucose, Bld: 138 mg/dL — ABNORMAL HIGH (ref 70–99)
HCT: 33 % — ABNORMAL LOW (ref 39.0–52.0)
Hemoglobin: 11.2 g/dL — ABNORMAL LOW (ref 13.0–17.0)
Potassium: 5.3 mmol/L — ABNORMAL HIGH (ref 3.5–5.1)
Sodium: 134 mmol/L — ABNORMAL LOW (ref 135–145)
TCO2: 27 mmol/L (ref 22–32)

## 2021-08-06 LAB — GLUCOSE, CAPILLARY: Glucose-Capillary: 130 mg/dL — ABNORMAL HIGH (ref 70–99)

## 2021-08-06 SURGERY — ARTERIOVENOUS (AV) FISTULA CREATION
Anesthesia: General | Laterality: Left

## 2021-08-06 MED ORDER — BUPIVACAINE HCL (PF) 0.5 % IJ SOLN
INTRAMUSCULAR | Status: AC
  Filled 2021-08-06: qty 10

## 2021-08-06 MED ORDER — EPHEDRINE SULFATE 50 MG/ML IJ SOLN
INTRAMUSCULAR | Status: DC | PRN
  Administered 2021-08-06 (×2): 5 mg via INTRAVENOUS

## 2021-08-06 MED ORDER — BUPIVACAINE HCL (PF) 0.5 % IJ SOLN
INTRAMUSCULAR | Status: DC | PRN
  Administered 2021-08-06: 5 mL via PERINEURAL
  Administered 2021-08-06: 10 mL via PERINEURAL

## 2021-08-06 MED ORDER — CHLORHEXIDINE GLUCONATE 0.12 % MT SOLN: 15.0000 mL | Freq: Once | OROMUCOSAL | Status: AC

## 2021-08-06 MED ORDER — OXYCODONE HCL 5 MG/5ML PO SOLN: 5.0000 mg | Freq: Once | ORAL | Status: DC | PRN

## 2021-08-06 MED ORDER — BUPIVACAINE LIPOSOME 1.3 % IJ SUSP
INTRAMUSCULAR | Status: AC
  Filled 2021-08-06: qty 20

## 2021-08-06 MED ORDER — OXYCODONE HCL 5 MG PO TABS
5.0000 mg | ORAL_TABLET | Freq: Once | ORAL | Status: DC | PRN
Start: 2021-08-06 — End: 2021-08-06

## 2021-08-06 MED ORDER — PROPOFOL 10 MG/ML IV BOLUS
INTRAVENOUS | Status: DC | PRN
  Administered 2021-08-06: 30 mg via INTRAVENOUS
  Administered 2021-08-06: 50 mg via INTRAVENOUS

## 2021-08-06 MED ORDER — PHENYLEPHRINE HCL (PRESSORS) 10 MG/ML IV SOLN
INTRAVENOUS | Status: AC
  Filled 2021-08-06: qty 1

## 2021-08-06 MED ORDER — FENTANYL CITRATE (PF) 100 MCG/2ML IJ SOLN: 25.0000 ug | INTRAMUSCULAR | Status: DC | PRN

## 2021-08-06 MED ORDER — ONDANSETRON HCL 4 MG/2ML IJ SOLN: 4.0000 mg | Freq: Four times a day (QID) | INTRAMUSCULAR | Status: DC | PRN

## 2021-08-06 MED ORDER — BUPIVACAINE LIPOSOME 1.3 % IJ SUSP
INTRAMUSCULAR | Status: DC | PRN
  Administered 2021-08-06: 27 mL via SURGICAL_CAVITY

## 2021-08-06 MED ORDER — HEPARIN SODIUM (PORCINE) 1000 UNIT/ML IJ SOLN
INTRAMUSCULAR | Status: AC
  Filled 2021-08-06: qty 1

## 2021-08-06 MED ORDER — PROPOFOL 10 MG/ML IV BOLUS
INTRAVENOUS | Status: AC
  Filled 2021-08-06: qty 20

## 2021-08-06 MED ORDER — CHLORHEXIDINE GLUCONATE 0.12 % MT SOLN
OROMUCOSAL | Status: AC
  Administered 2021-08-06: 15 mL via OROMUCOSAL
  Filled 2021-08-06: qty 15

## 2021-08-06 MED ORDER — CHLORHEXIDINE GLUCONATE CLOTH 2 % EX PADS: 6.0000 | MEDICATED_PAD | Freq: Once | CUTANEOUS | Status: DC

## 2021-08-06 MED ORDER — PROPOFOL 10 MG/ML IV BOLUS
INTRAVENOUS | Status: DC | PRN
  Administered 2021-08-06: 100 ug/kg/min via INTRAVENOUS

## 2021-08-06 MED ORDER — FAMOTIDINE 20 MG PO TABS
ORAL_TABLET | ORAL | Status: AC
  Administered 2021-08-06: 20 mg via ORAL
  Filled 2021-08-06: qty 1

## 2021-08-06 MED ORDER — ORAL CARE MOUTH RINSE: 15.0000 mL | Freq: Once | OROMUCOSAL | Status: AC

## 2021-08-06 MED ORDER — ACETAMINOPHEN 10 MG/ML IV SOLN: 1000.0000 mg | Freq: Once | INTRAVENOUS | Status: DC | PRN

## 2021-08-06 MED ORDER — CEFAZOLIN SODIUM-DEXTROSE 1-4 GM/50ML-% IV SOLN
INTRAVENOUS | Status: AC
  Filled 2021-08-06: qty 50

## 2021-08-06 MED ORDER — PROPOFOL 500 MG/50ML IV EMUL
INTRAVENOUS | Status: AC
  Filled 2021-08-06: qty 50

## 2021-08-06 MED ORDER — HYDROCODONE-ACETAMINOPHEN 5-325 MG PO TABS: 1.0000 | ORAL_TABLET | Freq: Four times a day (QID) | ORAL | 0 refills | Status: DC | PRN

## 2021-08-06 MED ORDER — HEPARIN SODIUM (PORCINE) 1000 UNIT/ML IJ SOLN
INTRAMUSCULAR | Status: DC | PRN
  Administered 2021-08-06: 3000 [IU] via INTRAVENOUS

## 2021-08-06 MED ORDER — SODIUM CHLORIDE 0.9 % IV SOLN: INTRAVENOUS | Status: DC

## 2021-08-06 MED ORDER — HEPARIN SODIUM (PORCINE) 5000 UNIT/ML IJ SOLN
INTRAMUSCULAR | Status: AC
  Filled 2021-08-06: qty 1

## 2021-08-06 MED ORDER — SODIUM CHLORIDE 0.9 % IV SOLN
INTRAVENOUS | Status: DC | PRN
  Administered 2021-08-06: 500 mL via INTRAMUSCULAR

## 2021-08-06 MED ORDER — FAMOTIDINE 20 MG PO TABS: 20.0000 mg | ORAL_TABLET | Freq: Once | ORAL | Status: AC

## 2021-08-06 MED ORDER — BUPIVACAINE HCL (PF) 0.5 % IJ SOLN
INTRAMUSCULAR | Status: AC
  Filled 2021-08-06: qty 30

## 2021-08-06 MED ORDER — FENTANYL CITRATE (PF) 100 MCG/2ML IJ SOLN
INTRAMUSCULAR | Status: AC
  Administered 2021-08-06: 25 ug via INTRAVENOUS
  Filled 2021-08-06: qty 2

## 2021-08-06 MED ORDER — HYDROMORPHONE HCL 1 MG/ML IJ SOLN: 1.0000 mg | Freq: Once | INTRAMUSCULAR | Status: DC | PRN

## 2021-08-06 MED ORDER — PAPAVERINE HCL 30 MG/ML IJ SOLN
INTRAMUSCULAR | Status: AC
  Filled 2021-08-06: qty 2

## 2021-08-06 SURGICAL SUPPLY — 56 items
APPLIER CLIP 11 MED OPEN (CLIP)
APPLIER CLIP 9.375 SM OPEN (CLIP)
BAG DECANTER FOR FLEXI CONT (MISCELLANEOUS) ×2 IMPLANT
BLADE SURG SZ11 CARB STEEL (BLADE) ×2 IMPLANT
BOOT SUTURE AID YELLOW STND (SUTURE) ×2 IMPLANT
BRUSH SCRUB EZ  4% CHG (MISCELLANEOUS) ×1
BRUSH SCRUB EZ 4% CHG (MISCELLANEOUS) ×1 IMPLANT
CANISTER SUCT 1200ML W/VALVE (MISCELLANEOUS) ×2 IMPLANT
CHLORAPREP W/TINT 26 (MISCELLANEOUS) ×2 IMPLANT
CLIP APPLIE 11 MED OPEN (CLIP) IMPLANT
CLIP APPLIE 9.375 SM OPEN (CLIP) IMPLANT
DERMABOND ADVANCED (GAUZE/BANDAGES/DRESSINGS) ×1
DERMABOND ADVANCED .7 DNX12 (GAUZE/BANDAGES/DRESSINGS) ×1 IMPLANT
DRESSING SURGICEL FIBRLLR 1X2 (HEMOSTASIS) IMPLANT
DRSG OPSITE POSTOP 4X6 (GAUZE/BANDAGES/DRESSINGS) ×2 IMPLANT
DRSG SURGICEL FIBRILLAR 1X2 (HEMOSTASIS)
ELECT CAUTERY BLADE 6.4 (BLADE) ×2 IMPLANT
ELECT REM PT RETURN 9FT ADLT (ELECTROSURGICAL) ×2
ELECTRODE REM PT RTRN 9FT ADLT (ELECTROSURGICAL) ×1 IMPLANT
GAUZE 4X4 16PLY ~~LOC~~+RFID DBL (SPONGE) ×2 IMPLANT
GEL ULTRASOUND 20GR AQUASONIC (MISCELLANEOUS) IMPLANT
GLOVE SURG ENC MOIS LTX SZ7 (GLOVE) ×8 IMPLANT
GLOVE SURG SYN 8.0 (GLOVE) ×2 IMPLANT
GLOVE SURG UNDER LTX SZ7.5 (GLOVE) ×8 IMPLANT
GOWN STRL REUS W/ TWL LRG LVL3 (GOWN DISPOSABLE) ×3 IMPLANT
GOWN STRL REUS W/ TWL XL LVL3 (GOWN DISPOSABLE) ×2 IMPLANT
GOWN STRL REUS W/TWL LRG LVL3 (GOWN DISPOSABLE) ×3
GOWN STRL REUS W/TWL XL LVL3 (GOWN DISPOSABLE) ×2
IV NS 500ML (IV SOLUTION) ×1
IV NS 500ML BAXH (IV SOLUTION) ×1 IMPLANT
KIT TURNOVER KIT A (KITS) ×2 IMPLANT
LABEL OR SOLS (LABEL) ×2 IMPLANT
LOOP RED MAXI  1X406MM (MISCELLANEOUS) ×1
LOOP VESSEL MAXI 1X406 RED (MISCELLANEOUS) ×1 IMPLANT
LOOP VESSEL MINI 0.8X406 BLUE (MISCELLANEOUS) ×2 IMPLANT
LOOPS BLUE MINI 0.8X406MM (MISCELLANEOUS) ×2
MANIFOLD NEPTUNE II (INSTRUMENTS) ×2 IMPLANT
NEEDLE FILTER BLUNT 18X 1/2SAF (NEEDLE) ×1
NEEDLE FILTER BLUNT 18X1 1/2 (NEEDLE) ×1 IMPLANT
NEEDLE HYPO 30X.5 LL (NEEDLE) IMPLANT
PACK EXTREMITY ARMC (MISCELLANEOUS) ×2 IMPLANT
PAD PREP 24X41 OB/GYN DISP (PERSONAL CARE ITEMS) ×2 IMPLANT
STOCKINETTE STRL 4IN 9604848 (GAUZE/BANDAGES/DRESSINGS) ×2 IMPLANT
SUT MNCRL+ 5-0 UNDYED PC-3 (SUTURE) ×1 IMPLANT
SUT MONOCRYL 5-0 (SUTURE) ×1
SUT PROLENE 6 0 BV (SUTURE) ×20 IMPLANT
SUT SILK 2 0 (SUTURE) ×1
SUT SILK 2-0 18XBRD TIE 12 (SUTURE) ×1 IMPLANT
SUT SILK 3 0 (SUTURE) ×1
SUT SILK 3-0 18XBRD TIE 12 (SUTURE) ×1 IMPLANT
SUT SILK 4 0 (SUTURE) ×1
SUT SILK 4-0 18XBRD TIE 12 (SUTURE) ×1 IMPLANT
SUT VIC AB 3-0 SH 27 (SUTURE) ×1
SUT VIC AB 3-0 SH 27X BRD (SUTURE) ×1 IMPLANT
SYR 20ML LL LF (SYRINGE) ×2 IMPLANT
SYR 3ML LL SCALE MARK (SYRINGE) ×2 IMPLANT

## 2021-08-06 NOTE — Anesthesia Procedure Notes (Addendum)
Anesthesia Regional Block: Supraclavicular block   Pre-Anesthetic Checklist: , timeout performed,  Correct Patient, Correct Site, Correct Laterality,  Correct Procedure, Correct Position, site marked,  Risks and benefits discussed,  Surgical consent,  Pre-op evaluation,  At surgeon's request and post-op pain management  Laterality: Upper and Left  Prep: chloraprep       Needles:  Injection technique: Single-shot  Needle Type: Stimiplex     Needle Length: 4cm  Needle Gauge: 20     Additional Needles:   Procedures:,,,, ultrasound used (permanent image in chart),,    Narrative:  Start time: 08/06/2021 7:58 AM End time: 08/06/2021 8:00 AM Injection made incrementally with aspirations every 15 mL.  Performed by: Personally  Anesthesiologist: Iran Ouch, MD  Additional Notes: Left Intercostobrachial neve block performed with 5cc bupivacaine by subcutaneous ring block.

## 2021-08-06 NOTE — Transfer of Care (Signed)
Immediate Anesthesia Transfer of Care Note  Patient: Wesley Henderson  Procedure(s) Performed: ARTERIOVENOUS (AV) FISTULA CREATION (BRACHIALCEPHALIC) (Left)  Patient Location: PACU  Anesthesia Type:General  Level of Consciousness: awake and alert   Airway & Oxygen Therapy: Patient Spontanous Breathing and Patient connected to face mask oxygen  Post-op Assessment: Report given to RN and Post -op Vital signs reviewed and stable  Post vital signs: Reviewed and stable  Last Vitals:  Vitals Value Taken Time  BP 123/61 08/06/21 1115  Temp    Pulse 70 08/06/21 1115  Resp 17 08/06/21 1115  SpO2 100 % 08/06/21 1115  Vitals shown include unvalidated device data.  Last Pain:  Vitals:   08/06/21 0710  TempSrc: Temporal  PainSc: 0-No pain         Complications: No notable events documented.

## 2021-08-06 NOTE — Interval H&P Note (Signed)
History and Physical Interval Note:  08/06/2021 8:01 AM  Wesley Henderson  has presented today for surgery, with the diagnosis of ESRD.  The various methods of treatment have been discussed with the patient and family. After consideration of risks, benefits and other options for treatment, the patient has consented to  Procedure(s): ARTERIOVENOUS (AV) FISTULA CREATION (BRACHIALCEPHALIC) (Left) as a surgical intervention.  The patient's history has been reviewed, patient examined, no change in status, stable for surgery.  I have reviewed the patient's chart and labs.  Questions were answered to the patient's satisfaction.     Hortencia Pilar

## 2021-08-06 NOTE — H&P (Signed)
$'@LOGO'B$ @   MRN : [id number]  Wesley Henderson is a 87 y.o. (04-28-1930) male who presents with chief complaint of need fistula made.  History of Present Illness:   The patient was previously on peritoneal dialysis however after difficulties his peritoneal dialysis catheter has been removed and he is now progressing to hemodialysis.  He is currently maintained via a right chest PermCath.  The patient is right-handed.   The patient has been considering the various methods of dialysis and wishes to proceed with hemodialysis and therefore creation of AV access.   The patient denies amaurosis fugax or recent TIA symptoms. There are no recent neurological changes noted. The patient denies claudication symptoms or rest pain symptoms. The patient denies history of DVT, PE or superficial thrombophlebitis. The patient denies recent episodes of angina or shortness of breath.   Today noninvasive studies show adequate access for creation of a left brachial cephalic fistula  Current Meds  Medication Sig   albuterol (VENTOLIN HFA) 108 (90 Base) MCG/ACT inhaler Inhale 2 puffs into the lungs every 6 (six) hours as needed for wheezing or shortness of breath.   aspirin EC 81 MG tablet Take 81 mg by mouth daily.   bisacodyl (DULCOLAX) 5 MG EC tablet Take 5 mg by mouth daily as needed for moderate constipation.   Carboxymethylcellul-Glycerin (CLEAR EYES FOR DRY EYES) 1-0.25 % SOLN Place 1 drop into both eyes daily as needed (dry eyes).   carvedilol (COREG) 12.5 MG tablet Take 12.5 mg by mouth 2 (two) times daily with a meal.    Menthol, Topical Analgesic, (BIOFREEZE EX) Apply 1 application topically daily as needed (pain).   polyethylene glycol (MIRALAX / GLYCOLAX) 17 g packet Take 17 g by mouth daily as needed. (Patient taking differently: Take 17 g by mouth daily as needed for moderate constipation.)   psyllium (HYDROCIL/METAMUCIL) 95 % PACK Take 1 packet by mouth daily.   simvastatin (ZOCOR) 20 MG tablet  Take 20 mg by mouth at bedtime.    torsemide (DEMADEX) 20 MG tablet Take 40 mg by mouth daily.    Past Medical History:  Diagnosis Date   Anemia of chronic renal disease    Anxiety    CAD (coronary artery disease)    CHF (congestive heart failure) (HCC)    Diabetic glomerulopathy (HCC)    ESRD (end stage renal disease) on dialysis ([place])    HD on MWF   Hx of CABG 09/1995   3 vessel   Hyperlipidemia    Hypertension    Secondary hyperparathyroidism of renal origin ([place])    Type II diabetes mellitus with nephropathy ([place])     Past Surgical History:  Procedure Laterality Date   CAPD INSERTION N/A 03/04/2018   Procedure: LAPAROSCOPIC INSERTION CONTINUOUS AMBULATORY PERITONEAL DIALYSIS  (CAPD) CATHETER;  Surgeon: Katha Cabal, MD;  Location: [facility];  Service: Vascular;  Laterality: N/A;   CAPD REVISION N/A 04/06/2018   Procedure: LAPAROSCOPIC REVISION CONTINUOUS AMBULATORY PERITONEAL DIALYSIS  (CAPD) CATHETER;  Surgeon: Katha Cabal, MD;  Location: [facility];  Service: Vascular;  Laterality: N/A;   CAPD REVISION  04/14/2021   CORONARY ARTERY BYPASS GRAFT  09/1995   3 vessels   DIALYSIS/PERMA CATHETER INSERTION N/A 02/07/2021   Procedure: DIALYSIS/PERMA CATHETER INSERTION;  Surgeon: Katha Cabal, MD;  Location: [facility];  Service: Cardiovascular;  Laterality: N/A;   PERITONEAL CATHETER REMOVAL  05/21/2021   PERITONEAL CATHETER REMOVAL  05/20/2018    Social History Social History  Tobacco Use   Smoking status: Former    Types: Cigars   Smokeless tobacco: Never  Vaping Use   Vaping Use: Never used  Substance Use Topics   Alcohol use: No   Drug use: No    Family History Family History  Problem Relation Age of Onset   Other Mother    Hypertension Mother     Allergies  Allergen Reactions   Hydralazine Swelling    Swelling hands feet and ankles     REVIEW OF SYSTEMS (Negative unless checked)  Constitutional: '[]'$ Weight loss  '[]'$ Fever   '[]'$ Chills Cardiac: '[]'$ Chest pain   '[]'$ Chest pressure   '[]'$ Palpitations   '[]'$ Shortness of breath when laying flat   '[]'$ Shortness of breath with exertion. Vascular:  '[]'$ Pain in legs with walking   '[]'$ Pain in legs at rest  '[]'$ History of DVT   '[]'$ Phlebitis   '[]'$ Swelling in legs   '[]'$ Varicose veins   '[]'$ Non-healing ulcers Pulmonary:   '[]'$ Uses home oxygen   '[]'$ Productive cough   '[]'$ Hemoptysis   '[]'$ Wheeze  '[]'$ COPD   '[]'$ Asthma Neurologic:  '[]'$ Dizziness   '[]'$ Seizures   '[]'$ History of stroke   '[]'$ History of TIA  '[]'$ Aphasia   '[]'$ Vissual changes   '[]'$ Weakness or numbness in arm   '[]'$ Weakness or numbness in leg Musculoskeletal:   '[]'$ Joint swelling   '[]'$ Joint pain   '[]'$ Low back pain Hematologic:  '[]'$ Easy bruising  '[]'$ Easy bleeding   '[]'$ Hypercoagulable state   '[]'$ Anemic Gastrointestinal:  '[]'$ Diarrhea   '[]'$ Vomiting  '[]'$ Gastroesophageal reflux/heartburn   '[]'$ Difficulty swallowing. Genitourinary:  '[x]'$ Chronic kidney disease   '[]'$ Difficult urination  '[]'$ Frequent urination   '[]'$ Blood in urine Skin:  '[]'$ Rashes   '[]'$ Ulcers  Psychological:  '[]'$ History of anxiety   '[]'$  History of major depression.  Physical Examination  Vitals:   08/06/21 0710  Pulse: 64  Resp: 18  Temp: 97.6 F (36.4 C)  TempSrc: Temporal  SpO2: 100%  Weight: 77.1 kg  Height: '5\' 8"'$  (1.727 m)   Body mass index is 25.85 kg/m. Gen: WD/WN, NAD Head: [place]/AT, No temporalis wasting.  Ear/Nose/Throat: Hearing grossly intact, nares w/o erythema or drainage Eyes: PER, EOMI, sclera nonicteric.  Neck: Supple, no gross masses or lesions.  No JVD.  Pulmonary:  Good air movement, no audible wheezing, no use of accessory muscles.  Cardiac: RRR, precordium non-hyperdynamic. Vascular:    Vessel Right Left  Radial Palpable Palpable  Brachial Palpable Palpable  Gastrointestinal: soft, non-distended. No guarding/no peritoneal signs.  Musculoskeletal: M/S 5/5 throughout.  No deformity.  Neurologic: CN 2-12 intact. Pain and light touch intact in extremities.  Symmetrical.  Speech is fluent. Motor exam  as listed above. Psychiatric: Judgment intact, Mood & affect appropriate for pt's clinical situation. Dermatologic: No rashes or ulcers noted.  No changes consistent with cellulitis.   CBC Lab Results  Component Value Date   WBC 7.5 07/28/2021   HGB 11.2 (L) 08/06/2021   HCT 33.0 (L) 08/06/2021   MCV 102.5 (H) 07/28/2021   PLT 192 07/28/2021    BMET    Component Value Date/Time   NA 134 (L) 08/06/2021 0729   K 5.3 (H) 08/06/2021 0729   CL 99 08/06/2021 0729   CO2 25 07/28/2021 0914   GLUCOSE 138 (H) 08/06/2021 0729   BUN 49 (H) 08/06/2021 0729   CREATININE 4.70 (H) 08/06/2021 0729   CALCIUM 8.2 (L) 07/28/2021 0914   GFRNONAA 10 (L) 07/28/2021 0914   GFRAA 9 (L) 04/01/2018 1415   Estimated Creatinine Clearance: 10.3 mL/min (A) (by C-G formula based on SCr of  4.7 mg/dL (H)).  COAG Lab Results  Component Value Date   INR 1.2 07/28/2021   INR 1.07 04/01/2018   INR 1.05 02/25/2018    Radiology [place] OR NERVE BLOCK-IMAGE ONLY [facility])  Result Date: 08/06/2021 There is no interpretation for this exam.  This order is for images obtained during a surgical procedure.  Please See "Surgeries" Tab for more information regarding the procedure.     Assessment/Plan 1. ESRD (end stage renal disease) ([place]) Recommend:   At this time the patient does not have appropriate extremity access for dialysis   Patient should have a left brachiocephalic AV fistula created.  It the veins appear to be sclerotic but are smaller than measured, and a brachial axillary graft Will be utilized instead.   The risks, benefits and alternative therapies were reviewed in detail with the patient.  All questions were answered.  The patient agrees to proceed with surgery.     2. Hyperlipidemia, unspecified hyperlipidemia type Continue statin as ordered and reviewed, no changes at this time    3. Essential hypertension Continue antihypertensive medications as already ordered, these medications have been  reviewed and there are no changes at this time.  Patient's blood pressure was very elevated today advised her to check at home.  If it is still elevated he should seek emergent care.  Currently the patient is asymptomatic.   4. Diabetes mellitus with ESRD (end-stage renal disease) ([place]) Continue hypoglycemic medications as already ordered, these medications have been reviewed and there are no changes at this time.   Hgb A1C to be monitored as already arranged by primary service    Hortencia Pilar, MD  08/06/2021 7:58 AM

## 2021-08-06 NOTE — Anesthesia Postprocedure Evaluation (Signed)
Anesthesia Post Note  Patient: Wesley Henderson  Procedure(s) Performed: ARTERIOVENOUS (AV) FISTULA CREATION (BRACHIALCEPHALIC) (Left)  Patient location during evaluation: PACU Anesthesia Type: General Level of consciousness: awake and alert Pain management: pain level controlled Vital Signs Assessment: post-procedure vital signs reviewed and stable Respiratory status: spontaneous breathing, nonlabored ventilation and respiratory function stable Cardiovascular status: blood pressure returned to baseline and stable Postop Assessment: no apparent nausea or vomiting Anesthetic complications: no   No notable events documented.   Last Vitals:  Vitals:   08/06/21 1145 08/06/21 1159  BP: (!) 158/81 (!) 160/81  Pulse: 66 65  Resp: 18 15  Temp: (!) 36.1 C (!) 36.3 C  SpO2: 99% 100%    Last Pain:  Vitals:   08/06/21 1159  TempSrc: Temporal  PainSc: 0-No pain                 [place] Ouch

## 2021-08-06 NOTE — Anesthesia Preprocedure Evaluation (Addendum)
Anesthesia Evaluation  Patient identified by MRN, date of birth, ID band Patient awake    Reviewed: Allergy & Precautions, H&P , NPO status , Patient's Chart, lab work & pertinent test results, reviewed documented beta blocker date and time   History of Anesthesia Complications Negative for: history of anesthetic complications  Airway Mallampati: III  TM Distance: <3 FB Neck ROM: limited    Dental  (+) Edentulous Upper, Edentulous Lower   Pulmonary neg shortness of breath, former smoker,    Pulmonary exam normal        Cardiovascular Exercise Tolerance: Good hypertension, (-) angina+ CAD, + CABG and +CHF  + dysrhythmias (RBBB) Atrial Fibrillation  Rhythm:Irregular Rate:Normal - Systolic murmurs TTE on 0000000 that revealed a normal LVEF of 60 to 65% with no RWMAs  Cardiology consult done this month due to findings on EKG from pre-op clinic. There are no plans for further work-up. Pt will be placed on anticoagulation.     Neuro/Psych PSYCHIATRIC DISORDERS Anxiety negative neurological ROS  negative psych ROS   GI/Hepatic negative GI ROS, Neg liver ROS, neg GERD  ,  Endo/Other  diabetes  Renal/GU ESRFRenal disease (HD MWF)     Musculoskeletal negative musculoskeletal ROS (+)   Abdominal Normal abdominal exam  (+)   Peds  Hematology  (+) anemia ,   Anesthesia Other Findings Past Medical History: No date: Anemia No date: Anxiety No date: CHF (congestive heart failure) (HCC) No date: Chronic kidney disease No date: Diabetes mellitus without complication (HCC)     Comment:  Pre-Diabetic No date: Hyperlipidemia No date: Hypertension  Past Surgical History: 03/04/2018: CAPD INSERTION; N/A     Comment:  Procedure: LAPAROSCOPIC INSERTION CONTINUOUS AMBULATORY               PERITONEAL DIALYSIS  (CAPD) CATHETER;  Surgeon: Katha Cabal, MD;  Location: [facility];  Service: Vascular;                 Laterality: N/A; No date: CORONARY ARTERY BYPASS GRAFT     Comment:  Oct 1996 No date: heart bypass     Reproductive/Obstetrics negative OB ROS                            Anesthesia Physical  Anesthesia Plan  ASA: III  Anesthesia Plan: General   Post-op Pain Management: GA combined w/ Regional for post-op pain   Induction: Intravenous  PONV Risk Score and Plan: 1 and Treatment may vary due to age or medical condition, TIVA and Propofol infusion  Airway Management Planned: Nasal Cannula and Natural Airway  Additional Equipment:   Intra-op Plan:   Post-operative Plan:   Informed Consent: I have reviewed the patients History and Physical, chart, labs and discussed the procedure including the risks, benefits and alternatives for the proposed anesthesia with the patient or authorized representative who has indicated his/her understanding and acceptance.     Dental Advisory Given  Plan Discussed with: Anesthesiologist and CRNA  Anesthesia Plan Comments: (Patient consented for risks of anesthesia including but not limited to:  - adverse reactions to medications - damage to teeth, lips or other oral mucosa - sore throat or hoarseness - Damage to heart, brain, lungs or loss of life  Patient voiced understanding.)        Anesthesia Quick Evaluation

## 2021-08-06 NOTE — Op Note (Signed)
     OPERATIVE NOTE   PROCEDURE: left brachial cephalic arteriovenous fistula placement  PRE-OPERATIVE DIAGNOSIS: End Stage Renal Disease  POST-OPERATIVE DIAGNOSIS: End Stage Renal Disease  SURGEON: Hortencia Pilar  ASSISTANT(S): None  ANESTHESIA: regional  ESTIMATED BLOOD LOSS: <50 cc  FINDING(S): 3.5 mm cephalic vein  SPECIMEN(S):  none  INDICATIONS:   Wesley Henderson is a 87 y.o. male who presents with end stage renal disease.  The patient is scheduled for left brachiocephalic arteriovenous fistula placement.  The patient is aware the risks include but are not limited to: bleeding, infection, steal syndrome, nerve damage, ischemic monomelic neuropathy, failure to mature, and need for additional procedures.  The patient is aware of the risks of the procedure and elects to proceed forward.  DESCRIPTION: After full informed written consent was obtained from the patient, the patient was brought back to the operating room and placed supine upon the operating table.  Prior to induction, the patient received IV antibiotics.   After obtaining adequate anesthesia, the patient was then prepped and draped in the standard fashion for a left arm access procedure.   A first assistant was required to provide a safe and appropriate environment for executing the surgery.  The assistant was integral in providing retraction, exposure, running suture providing suction and in the closing process.   A curvilinear incision was then created midway between the radial impulse and the cephalic vein. The cephalic vein was then identified and dissected circumferentially. It was marked with a surgical marker.    Attention was then turned to the brachial artery which was exposed through the same incision and looped proximally and distally. Side branches were controlled with 4-0 silk ties.  The distal segment of the vein was ligated with a  2-0 silk, and the vein was transected.  The proximal segment was  interrogated with serial dilators.  The vein accepted up to a 3.5 mm dilator without any difficulty. Heparinized saline was infused into the vein and clamped it with a small bulldog.  At this point, I reset my exposure of the brachial artery and controlled the artery with vessel loops proximally and distally.  An arteriotomy was then made with a #11 blade, and extended with a Potts scissor.  Heparinized saline was injected proximal and distal into the radial artery.  The vein was then approximated to the artery while the artery was in its native bed and subsequently the vein was beveled using Potts scissors. The vein was then sewn to the artery in an end-to-side configuration with interrupted sutures of 6-0 Prolene.  Prior to completing this anastomosis Flushing maneuvers were performed and the artery was allowed to forward and back bleed.  There was no evidence of clot from any vessels.  I completed the anastomosis in the usual fashion and then released all vessel loops and clamps.    There was good  thrill in the venous outflow, and there was 1+ palpable radial pulse.  At this point, I irrigated out the surgical wound.  There was no further active bleeding.  The subcutaneous tissue was reapproximated with a running stitch of 3-0 Vicryl.  The skin was then reapproximated with a running subcuticular stitch of 4-0 Vicryl.  The skin was then cleaned, dried, and reinforced with Dermabond.    The patient tolerated this procedure well.   COMPLICATIONS: None  CONDITION: Wesley Henderson [facility]  Office: [phone]   08/06/2021, 11:14 AM

## 2021-08-06 NOTE — Discharge Instructions (Signed)
AMBULATORY SURGERY  ?DISCHARGE INSTRUCTIONS ? ? ?The drugs that you were given will stay in your system until tomorrow so for the next 24 hours you should not: ? ?Drive an automobile ?Make any legal decisions ?Drink any alcoholic beverage ? ? ?You may resume regular meals tomorrow.  Today it is better to start with liquids and gradually work up to solid foods. ? ?You may eat anything you prefer, but it is better to start with liquids, then soup and crackers, and gradually work up to solid foods. ? ? ?Please notify your doctor immediately if you have any unusual bleeding, trouble breathing, redness and pain at the surgery site, drainage, fever, or pain not relieved by medication. ? ? ? ?Additional Instructions: ? ? ? ?Please contact your physician with any problems or Same Day Surgery at [phone], Monday through Friday 6 am to 4 pm, or [place] at [place] [facility] number at [phone].  ?

## 2021-08-07 ENCOUNTER — Encounter: Payer: Self-pay | Admitting: Vascular Surgery

## 2021-08-20 ENCOUNTER — Other Ambulatory Visit (INDEPENDENT_AMBULATORY_CARE_PROVIDER_SITE_OTHER): Payer: Self-pay | Admitting: Vascular Surgery

## 2021-08-20 DIAGNOSIS — N186 End stage renal disease: Secondary | ICD-10-CM

## 2021-08-20 DIAGNOSIS — Z9889 Other specified postprocedural states: Secondary | ICD-10-CM

## 2021-08-21 ENCOUNTER — Encounter (INDEPENDENT_AMBULATORY_CARE_PROVIDER_SITE_OTHER): Payer: Medicare Other

## 2021-08-21 ENCOUNTER — Other Ambulatory Visit: Payer: Self-pay

## 2021-08-21 ENCOUNTER — Ambulatory Visit (INDEPENDENT_AMBULATORY_CARE_PROVIDER_SITE_OTHER): Payer: Medicare Other | Admitting: Vascular Surgery

## 2021-08-21 ENCOUNTER — Ambulatory Visit (INDEPENDENT_AMBULATORY_CARE_PROVIDER_SITE_OTHER): Payer: Medicare Other | Admitting: Nurse Practitioner

## 2021-08-21 ENCOUNTER — Ambulatory Visit (INDEPENDENT_AMBULATORY_CARE_PROVIDER_SITE_OTHER): Payer: Medicare Other

## 2021-08-21 VITALS — BP 188/76 | HR 97 | Ht 68.0 in | Wt 160.0 lb

## 2021-08-21 DIAGNOSIS — E785 Hyperlipidemia, unspecified: Secondary | ICD-10-CM

## 2021-08-21 DIAGNOSIS — I10 Essential (primary) hypertension: Secondary | ICD-10-CM

## 2021-08-21 DIAGNOSIS — N186 End stage renal disease: Secondary | ICD-10-CM | POA: Diagnosis not present

## 2021-08-21 DIAGNOSIS — Z9889 Other specified postprocedural states: Secondary | ICD-10-CM

## 2021-08-26 DIAGNOSIS — I4891 Unspecified atrial fibrillation: Secondary | ICD-10-CM | POA: Insufficient documentation

## 2021-08-26 DIAGNOSIS — I4892 Unspecified atrial flutter: Secondary | ICD-10-CM | POA: Insufficient documentation

## 2021-08-30 ENCOUNTER — Encounter (INDEPENDENT_AMBULATORY_CARE_PROVIDER_SITE_OTHER): Payer: Self-pay | Admitting: Nurse Practitioner

## 2021-08-30 NOTE — Progress Notes (Signed)
Subjective:    Patient ID: Wesley Henderson, male    DOB: 12-27-1929, 87 y.o.   MRN: [id number] Chief Complaint  Patient presents with   Follow-up    2 wk [facility]  post A/V fistula creation     Wesley Henderson is an 87 year old male that presents today after left brachiocephalic AV fistula placement on 08/06/2021.  The incision site is healing well.  No evidence of dehiscence.  He denies any fevers or chills.  He denies any significant drainage.  The patient has a flow volume of 2006.  There is no evidence of significant stenosis within his fistula.   Review of Systems  Skin:  Positive for wound.  All other systems reviewed and are negative.     Objective:   Physical Exam Vitals reviewed.  HENT:     Head: Normocephalic.  Cardiovascular:     Rate and Rhythm: Normal rate.     Pulses: Normal pulses.          Radial pulses are 2+ on the left side.     Arteriovenous access: Left arteriovenous access is present.    Comments: Good thrill and bruit Pulmonary:     Effort: Pulmonary effort is normal.  Skin:    Capillary Refill: Capillary refill takes less than 2 seconds.  Neurological:     Mental Status: He is alert and oriented to person, place, and time.  Psychiatric:        Mood and Affect: Mood normal.        Behavior: Behavior normal.        Thought Content: Thought content normal.        Judgment: Judgment normal.    BP (!) 188/76   Pulse 97   Ht '5\' 8"'$  (1.727 m)   Wt 160 lb (72.6 kg)   BMI 24.33 kg/m   Past Medical History:  Diagnosis Date   Anemia of chronic renal disease    Anxiety    CAD (coronary artery disease)    CHF (congestive heart failure) (HCC)    Diabetic glomerulopathy (HCC)    ESRD (end stage renal disease) on dialysis (HCC)    HD on MWF   Hx of CABG 09/1995   3 vessel   Hyperlipidemia    Hypertension    Secondary hyperparathyroidism of renal origin ([place])    Type II diabetes mellitus with nephropathy ([place])     Social History   Socioeconomic  History   Marital status: Married    Spouse name: Enid Derry   Number of children: 2   Years of education: Not on file   Highest education level: Not on file  Occupational History   Not on file  Tobacco Use   Smoking status: Former    Types: Cigars   Smokeless tobacco: Never  Vaping Use   Vaping Use: Never used  Substance and Sexual Activity   Alcohol use: No   Drug use: No   Sexual activity: Not on file  Other Topics Concern   Not on file  Social History Narrative   Not on file   Social Determinants of Health   Financial Resource Strain: Not on file  Food Insecurity: Not on file  Transportation Needs: Not on file  Physical Activity: Not on file  Stress: Not on file  Social Connections: Not on file  Intimate Partner Violence: Not on file    Past Surgical History:  Procedure Laterality Date   AV FISTULA PLACEMENT Left 08/06/2021   Procedure: ARTERIOVENOUS (  AV) FISTULA CREATION (BRACHIALCEPHALIC);  Surgeon: Katha Cabal, MD;  Location: [facility];  Service: Vascular;  Laterality: Left;   CAPD INSERTION N/A 03/04/2018   Procedure: LAPAROSCOPIC INSERTION CONTINUOUS AMBULATORY PERITONEAL DIALYSIS  (CAPD) CATHETER;  Surgeon: Katha Cabal, MD;  Location: [facility];  Service: Vascular;  Laterality: N/A;   CAPD REVISION N/A 04/06/2018   Procedure: LAPAROSCOPIC REVISION CONTINUOUS AMBULATORY PERITONEAL DIALYSIS  (CAPD) CATHETER;  Surgeon: Katha Cabal, MD;  Location: [facility];  Service: Vascular;  Laterality: N/A;   CAPD REVISION  04/14/2021   CORONARY ARTERY BYPASS GRAFT  09/1995   3 vessels   DIALYSIS/PERMA CATHETER INSERTION N/A 02/07/2021   Procedure: DIALYSIS/PERMA CATHETER INSERTION;  Surgeon: Katha Cabal, MD;  Location: [facility];  Service: Cardiovascular;  Laterality: N/A;   PERITONEAL CATHETER REMOVAL  05/21/2021   PERITONEAL CATHETER REMOVAL  05/20/2018    Family History  Problem Relation Age of Onset   Other Mother    Hypertension  Mother     Allergies  Allergen Reactions   Hydralazine Swelling    Swelling hands feet and ankles    CBC Latest Ref Rng & Units 08/06/2021 07/28/2021 02/10/2021  WBC 4.0 - 10.5 K/uL - 7.5 8.1  Hemoglobin 13.0 - 17.0 g/dL 11.2(L) 11.1(L) 8.2(L)  Hematocrit 39.0 - 52.0 % 33.0(L) 32.6(L) 23.8(L)  Platelets 150 - 400 K/uL - 192 294      CMP     Component Value Date/Time   NA 134 (L) 08/06/2021 0729   K 5.3 (H) 08/06/2021 0729   CL 99 08/06/2021 0729   CO2 25 07/28/2021 0914   GLUCOSE 138 (H) 08/06/2021 0729   BUN 49 (H) 08/06/2021 0729   CREATININE 4.70 (H) 08/06/2021 0729   CALCIUM 8.2 (L) 07/28/2021 0914   PROT 5.0 (L) 02/05/2021 1107   ALBUMIN 1.8 (L) 02/07/2021 1240   AST 27 02/05/2021 1107   ALT 18 02/05/2021 1107   ALKPHOS 76 02/05/2021 1107   BILITOT 0.7 02/05/2021 1107   GFRNONAA 10 (L) 07/28/2021 0914   GFRAA 9 (L) 04/01/2018 1415     No results found.     Assessment & Plan:   1. ESRD (end stage renal disease) ([place]) The patient has a good thrill and bruit however his fistula is not yet very prominent.  We will have the patient return in 4 to 5 weeks to evaluate his maturation.  2. Hyperlipidemia, unspecified hyperlipidemia type Continue statin as ordered and reviewed, no changes at this time   3. Essential hypertension Continue antihypertensive medications as already ordered, these medications have been reviewed and there are no changes at this time.    Current Outpatient Medications on File Prior to Visit  Medication Sig Dispense Refill   albuterol (PROVENTIL) (2.5 MG/3ML) 0.083% nebulizer solution Take 2.5 mg by nebulization every 6 (six) hours as needed for wheezing or shortness of breath.     albuterol (VENTOLIN HFA) 108 (90 Base) MCG/ACT inhaler Inhale 2 puffs into the lungs every 6 (six) hours as needed for wheezing or shortness of breath. 8 g 0   aspirin EC 81 MG tablet Take 81 mg by mouth daily.     bisacodyl (DULCOLAX) 5 MG EC tablet Take 5 mg by  mouth daily as needed for moderate constipation.     Carboxymethylcellul-Glycerin (CLEAR EYES FOR DRY EYES) 1-0.25 % SOLN Place 1 drop into both eyes daily as needed (dry eyes).     carvedilol (COREG) 12.5 MG tablet Take 12.5  mg by mouth 2 (two) times daily with a meal.      HYDROcodone-acetaminophen (NORCO) 5-325 MG tablet Take 1 tablet by mouth every 6 (six) hours as needed for moderate pain or severe pain. 20 tablet 0   Menthol, Topical Analgesic, (BIOFREEZE EX) Apply 1 application topically daily as needed (pain).     polyethylene glycol (MIRALAX / GLYCOLAX) 17 g packet Take 17 g by mouth daily as needed. 30 each 0   psyllium (HYDROCIL/METAMUCIL) 95 % PACK Take 1 packet by mouth daily.     simvastatin (ZOCOR) 20 MG tablet Take 20 mg by mouth at bedtime.      torsemide (DEMADEX) 20 MG tablet Take 40 mg by mouth daily.     No current facility-administered medications on file prior to visit.    There are no Patient Instructions on file for this visit. No follow-ups on file.   Kris Hartmann, NP

## 2021-09-17 NOTE — Progress Notes (Signed)
Patient ID: Wesley Henderson, male   DOB: 1930/09/28, 87 y.o.   MRN: [id number]  No chief complaint on file.   HPI Wesley Henderson is a 87 y.o. male.    The patient returns to the office for followup of their dialysis access.   He is s/p left brachiocephalic AV fistula placement on 08/06/2021.    The function of the access has been stable. The patient denies hand pain or other symptoms consistent with steal phenomena.  No significant arm swelling.  The patient denies redness or swelling at the access site.   The patient denies amaurosis fugax or recent TIA symptoms. There are no recent neurological changes noted. The patient denies claudication symptoms or rest pain symptoms. The patient denies history of DVT, PE or superficial thrombophlebitis. The patient denies recent episodes of angina or shortness of breath.          Past Medical History:  Diagnosis Date   Anemia of chronic renal disease    Anxiety    CAD (coronary artery disease)    CHF (congestive heart failure) (HCC)    Diabetic glomerulopathy (HCC)    ESRD (end stage renal disease) on dialysis ([place])    HD on MWF   Hx of CABG 09/1995   3 vessel   Hyperlipidemia    Hypertension    Secondary hyperparathyroidism of renal origin ([place])    Type II diabetes mellitus with nephropathy ([place])     Past Surgical History:  Procedure Laterality Date   AV FISTULA PLACEMENT Left 08/06/2021   Procedure: ARTERIOVENOUS (AV) FISTULA CREATION (BRACHIALCEPHALIC);  Surgeon: Katha Cabal, MD;  Location: [facility];  Service: Vascular;  Laterality: Left;   CAPD INSERTION N/A 03/04/2018   Procedure: LAPAROSCOPIC INSERTION CONTINUOUS AMBULATORY PERITONEAL DIALYSIS  (CAPD) CATHETER;  Surgeon: Katha Cabal, MD;  Location: [facility];  Service: Vascular;  Laterality: N/A;   CAPD REVISION N/A 04/06/2018   Procedure: LAPAROSCOPIC REVISION CONTINUOUS AMBULATORY PERITONEAL DIALYSIS  (CAPD) CATHETER;  Surgeon: Katha Cabal,  MD;  Location: [facility];  Service: Vascular;  Laterality: N/A;   CAPD REVISION  04/14/2021   CORONARY ARTERY BYPASS GRAFT  09/1995   3 vessels   DIALYSIS/PERMA CATHETER INSERTION N/A 02/07/2021   Procedure: DIALYSIS/PERMA CATHETER INSERTION;  Surgeon: Katha Cabal, MD;  Location: [place] [facility];  Service: Cardiovascular;  Laterality: N/A;   PERITONEAL CATHETER REMOVAL  05/21/2021   PERITONEAL CATHETER REMOVAL  05/20/2018      Allergies  Allergen Reactions   Hydralazine Swelling    Swelling hands feet and ankles    Current Outpatient Medications  Medication Sig Dispense Refill   albuterol (PROVENTIL) (2.5 MG/3ML) 0.083% nebulizer solution Take 2.5 mg by nebulization every 6 (six) hours as needed for wheezing or shortness of breath.     albuterol (VENTOLIN HFA) 108 (90 Base) MCG/ACT inhaler Inhale 2 puffs into the lungs every 6 (six) hours as needed for wheezing or shortness of breath. 8 g 0   aspirin EC 81 MG tablet Take 81 mg by mouth daily.     bisacodyl (DULCOLAX) 5 MG EC tablet Take 5 mg by mouth daily as needed for moderate constipation.     Carboxymethylcellul-Glycerin (CLEAR EYES FOR DRY EYES) 1-0.25 % SOLN Place 1 drop into both eyes daily as needed (dry eyes).     carvedilol (COREG) 12.5 MG tablet Take 12.5 mg by mouth 2 (two) times daily with a meal.      HYDROcodone-acetaminophen ([place])  5-325 MG tablet Take 1 tablet by mouth every 6 (six) hours as needed for moderate pain or severe pain. 20 tablet 0   Menthol, Topical Analgesic, (BIOFREEZE EX) Apply 1 application topically daily as needed (pain).     polyethylene glycol (MIRALAX / GLYCOLAX) 17 g packet Take 17 g by mouth daily as needed. 30 each 0   psyllium (HYDROCIL/METAMUCIL) 95 % PACK Take 1 packet by mouth daily.     simvastatin (ZOCOR) 20 MG tablet Take 20 mg by mouth at bedtime.      torsemide (DEMADEX) 20 MG tablet Take 40 mg by mouth daily.     No current facility-administered medications for this  visit.        Physical Exam There were no vitals taken for this visit. Gen:  WD/WN, NAD Skin: incision C/D/I; left AV access good thrill and good bruit     Assessment/Plan: 1. End stage renal disease ([place]) Recommend:  The patient is doing well and currently has adequate dialysis access.  OK to use the access after October 06, 2021.  The patient's dialysis center is not reporting any access issues.  Flow pattern is stable.  The patient should have a duplex ultrasound of the dialysis access in 6 months. The patient will follow-up with me in the office after each ultrasound    - VAS [place] (AVF, AVG); Future      Hortencia Pilar 09/17/2021, 5:59 PM   This note was created with Dragon medical transcription system.  Any errors from dictation are unintentional.

## 2021-09-18 ENCOUNTER — Other Ambulatory Visit: Payer: Self-pay

## 2021-09-18 ENCOUNTER — Ambulatory Visit (INDEPENDENT_AMBULATORY_CARE_PROVIDER_SITE_OTHER): Payer: Medicare Other | Admitting: Vascular Surgery

## 2021-09-18 VITALS — BP 170/65 | HR 101 | Ht 68.0 in | Wt 161.0 lb

## 2021-09-18 DIAGNOSIS — N186 End stage renal disease: Secondary | ICD-10-CM

## 2021-09-20 ENCOUNTER — Encounter (INDEPENDENT_AMBULATORY_CARE_PROVIDER_SITE_OTHER): Payer: Self-pay | Admitting: Vascular Surgery

## 2021-11-24 ENCOUNTER — Ambulatory Visit (INDEPENDENT_AMBULATORY_CARE_PROVIDER_SITE_OTHER): Payer: Medicare Other

## 2021-11-24 ENCOUNTER — Other Ambulatory Visit: Payer: Self-pay

## 2021-11-24 ENCOUNTER — Ambulatory Visit (INDEPENDENT_AMBULATORY_CARE_PROVIDER_SITE_OTHER): Payer: Medicare Other | Admitting: Vascular Surgery

## 2021-11-24 VITALS — BP 162/116 | HR 123 | Ht 68.0 in | Wt 161.0 lb

## 2021-11-24 DIAGNOSIS — E1122 Type 2 diabetes mellitus with diabetic chronic kidney disease: Secondary | ICD-10-CM | POA: Diagnosis not present

## 2021-11-24 DIAGNOSIS — I10 Essential (primary) hypertension: Secondary | ICD-10-CM | POA: Diagnosis not present

## 2021-11-24 DIAGNOSIS — I4891 Unspecified atrial fibrillation: Secondary | ICD-10-CM

## 2021-11-24 DIAGNOSIS — I4892 Unspecified atrial flutter: Secondary | ICD-10-CM

## 2021-11-24 DIAGNOSIS — N186 End stage renal disease: Secondary | ICD-10-CM

## 2021-11-24 DIAGNOSIS — I25118 Atherosclerotic heart disease of native coronary artery with other forms of angina pectoris: Secondary | ICD-10-CM

## 2021-11-24 NOTE — H&P (View-Only) (Signed)
MRN : [id number]  Wesley Henderson is a 87 y.o. (1930-04-25) male who presents with chief complaint of check access.  History of Present Illness:   The patient returns to the office for followup of their dialysis access.    He is s/p left brachiocephalic AV fistula placement on 08/06/2021.     The function of the access has been stable. The patient denies hand pain or other symptoms consistent with steal phenomena.  No significant arm swelling.   The patient denies redness or swelling at the access site.    The patient denies amaurosis fugax or recent TIA symptoms. There are no recent neurological changes noted. The patient denies claudication symptoms or rest pain symptoms. The patient denies history of DVT, PE or superficial thrombophlebitis. The patient denies recent episodes of angina or shortness of breath.   Duplex ultrasound of the AV access shows a patent access.  The previously noted stenosis is unchanged compared to last study.    No outpatient medications have been marked as taking for the 11/24/21 encounter (Appointment) with Delana Meyer, Dolores Lory, MD.    Past Medical History:  Diagnosis Date   Anemia of chronic renal disease    Anxiety    CAD (coronary artery disease)    CHF (congestive heart failure) ([place])    Diabetic glomerulopathy ([place])    ESRD (end stage renal disease) on dialysis [facility])    HD on MWF   Hx of CABG 09/1995   3 vessel   Hyperlipidemia    Hypertension    Secondary hyperparathyroidism of renal origin ([place])    Type II diabetes mellitus with nephropathy [facility])     Past Surgical History:  Procedure Laterality Date   AV FISTULA PLACEMENT Left 08/06/2021   Procedure: ARTERIOVENOUS (AV) FISTULA CREATION (BRACHIALCEPHALIC);  Surgeon: Katha Cabal, MD;  Location: [facility];  Service: Vascular;  Laterality: Left;   CAPD INSERTION N/A 03/04/2018   Procedure: LAPAROSCOPIC INSERTION CONTINUOUS AMBULATORY PERITONEAL DIALYSIS  (CAPD) CATHETER;  Surgeon:  Katha Cabal, MD;  Location: [facility];  Service: Vascular;  Laterality: N/A;   CAPD REVISION N/A 04/06/2018   Procedure: LAPAROSCOPIC REVISION CONTINUOUS AMBULATORY PERITONEAL DIALYSIS  (CAPD) CATHETER;  Surgeon: Katha Cabal, MD;  Location: [facility];  Service: Vascular;  Laterality: N/A;   CAPD REVISION  04/14/2021   CORONARY ARTERY BYPASS GRAFT  09/1995   3 vessels   DIALYSIS/PERMA CATHETER INSERTION N/A 02/07/2021   Procedure: DIALYSIS/PERMA CATHETER INSERTION;  Surgeon: Katha Cabal, MD;  Location: [place] [facility];  Service: Cardiovascular;  Laterality: N/A;   PERITONEAL CATHETER REMOVAL  05/21/2021   PERITONEAL CATHETER REMOVAL  05/20/2018    Social History Social History   Tobacco Use   Smoking status: Former    Types: Cigars   Smokeless tobacco: Never  Vaping Use   Vaping Use: Never used  Substance Use Topics   Alcohol use: No   Drug use: No    Family History Family History  Problem Relation Age of Onset   Other Mother    Hypertension Mother     Allergies  Allergen Reactions   Hydralazine Swelling    Swelling hands feet and ankles     REVIEW OF SYSTEMS (Negative unless checked)  Constitutional: [] Weight loss  [] Fever  [] Chills Cardiac: [] Chest pain   [] Chest pressure   [] Palpitations   [] Shortness of breath when laying flat   [] Shortness of breath with exertion. Vascular:  [] Pain in legs with walking   []   Pain in legs at rest  [] History of DVT   [] Phlebitis   [] Swelling in legs   [] Varicose veins   [] Non-healing ulcers Pulmonary:   [] Uses home oxygen   [] Productive cough   [] Hemoptysis   [] Wheeze  [] COPD   [] Asthma Neurologic:  [] Dizziness   [] Seizures   [] History of stroke   [] History of TIA  [] Aphasia   [] Vissual changes   [] Weakness or numbness in arm   [] Weakness or numbness in leg Musculoskeletal:   [] Joint swelling   [] Joint pain   [] Low back pain Hematologic:  [] Easy bruising  [] Easy bleeding   [] Hypercoagulable state    [] Anemic Gastrointestinal:  [] Diarrhea   [] Vomiting  [] Gastroesophageal reflux/heartburn   [] Difficulty swallowing. Genitourinary:  [x] Chronic kidney disease   [] Difficult urination  [] Frequent urination   [] Blood in urine Skin:  [] Rashes   [] Ulcers  Psychological:  [] History of anxiety   []  History of major depression.  Physical Examination  There were no vitals filed for this visit. There is no height or weight on file to calculate BMI. Gen: WD/WN, NAD Head: [place]/AT, No temporalis wasting.  Ear/Nose/Throat: Hearing grossly intact, nares w/o erythema or drainage Eyes: PER, EOMI, sclera nonicteric.  Neck: Supple, no gross masses or lesions.  No JVD.  Pulmonary:  Good air movement, no audible wheezing, no use of accessory muscles.  Cardiac: RRR, precordium non-hyperdynamic. Vascular:    left AV fistula good thrill and good bruit Vessel Right Left  Radial Palpable Palpable  Brachial Palpable Palpable  Gastrointestinal: soft, non-distended. No guarding/no peritoneal signs.  Musculoskeletal: M/S 5/5 throughout.  No deformity.  Neurologic: CN 2-12 intact. Pain and light touch intact in extremities.  Symmetrical.  Speech is fluent. Motor exam as listed above. Psychiatric: Judgment intact, Mood & affect appropriate for pt's clinical situation. Dermatologic: No rashes or ulcers noted.  No changes consistent with cellulitis.   CBC Lab Results  Component Value Date   WBC 7.5 07/28/2021   HGB 11.2 (L) 08/06/2021   HCT 33.0 (L) 08/06/2021   MCV 102.5 (H) 07/28/2021   PLT 192 07/28/2021    BMET    Component Value Date/Time   NA 134 (L) 08/06/2021 0729   K 5.3 (H) 08/06/2021 0729   CL 99 08/06/2021 0729   CO2 25 07/28/2021 0914   GLUCOSE 138 (H) 08/06/2021 0729   BUN 49 (H) 08/06/2021 0729   CREATININE 4.70 (H) 08/06/2021 0729   CALCIUM 8.2 (L) 07/28/2021 0914   GFRNONAA 10 (L) 07/28/2021 0914   GFRAA 9 (L) 04/01/2018 1415   CrCl cannot be calculated (Patient's most recent lab  result is older than the maximum 21 days allowed.).  COAG Lab Results  Component Value Date   INR 1.2 07/28/2021   INR 1.07 04/01/2018   INR 1.05 02/25/2018    Radiology No results found.   Assessment/Plan 1. End stage renal disease ([place]) Recommend:  The patient is doing well and currently has adequate dialysis access. The patient's dialysis center is not reporting any access issues. Flow pattern is stable when compared to the prior ultrasound.  The patient should have a duplex ultrasound of the dialysis access in 6 months. The patient will follow-up with me in the office after each ultrasound    - VAS [place] (AVF, AVG); Future  2. Type 2 diabetes mellitus with ESRD (end-stage renal disease) ([place]) Continue hypoglycemic medications as already ordered, these medications have been reviewed and there are no changes at this time.  Hgb  A1C to be monitored as already arranged by primary service   3. Primary hypertension Continue antihypertensive medications as already ordered, these medications have been reviewed and there are no changes at this time.   4. Atrial fibrillation and flutter ([place]) Continue antiarrhythmia medications as already ordered, these medications have been reviewed and there are no changes at this time.  Continue anticoagulation as ordered by Cardiology Service   5. Coronary artery disease of native artery of native heart with stable angina pectoris (HCC) Continue cardiac and antihypertensive medications as already ordered and reviewed, no changes at this time.  Continue statin as ordered and reviewed, no changes at this time  Nitrates PRN for chest pain     Hortencia Pilar, MD  11/24/2021 10:24 AM

## 2021-11-24 NOTE — Progress Notes (Signed)
MRN : [id number]  Wesley Henderson is a 87 y.o. (1930-09-11) male who presents with chief complaint of check access.  History of Present Illness:   The patient returns to the office for followup of their dialysis access.    He is s/p left brachiocephalic AV fistula placement on 08/06/2021.     The function of the access has been stable. The patient denies hand pain or other symptoms consistent with steal phenomena.  No significant arm swelling.   The patient denies redness or swelling at the access site.    The patient denies amaurosis fugax or recent TIA symptoms. There are no recent neurological changes noted. The patient denies claudication symptoms or rest pain symptoms. The patient denies history of DVT, PE or superficial thrombophlebitis. The patient denies recent episodes of angina or shortness of breath.   Duplex ultrasound of the AV access shows a patent access.  The previously noted stenosis is unchanged compared to last study.    No outpatient medications have been marked as taking for the 11/24/21 encounter (Appointment) with Delana Meyer, Dolores Lory, MD.    Past Medical History:  Diagnosis Date   Anemia of chronic renal disease    Anxiety    CAD (coronary artery disease)    CHF (congestive heart failure) ([place])    Diabetic glomerulopathy ([place])    ESRD (end stage renal disease) on dialysis [facility])    HD on MWF   Hx of CABG 09/1995   3 vessel   Hyperlipidemia    Hypertension    Secondary hyperparathyroidism of renal origin ([place])    Type II diabetes mellitus with nephropathy [facility])     Past Surgical History:  Procedure Laterality Date   AV FISTULA PLACEMENT Left 08/06/2021   Procedure: ARTERIOVENOUS (AV) FISTULA CREATION (BRACHIALCEPHALIC);  Surgeon: Katha Cabal, MD;  Location: [facility];  Service: Vascular;  Laterality: Left;   CAPD INSERTION N/A 03/04/2018   Procedure: LAPAROSCOPIC INSERTION CONTINUOUS AMBULATORY PERITONEAL DIALYSIS  (CAPD) CATHETER;  Surgeon:  Katha Cabal, MD;  Location: [facility];  Service: Vascular;  Laterality: N/A;   CAPD REVISION N/A 04/06/2018   Procedure: LAPAROSCOPIC REVISION CONTINUOUS AMBULATORY PERITONEAL DIALYSIS  (CAPD) CATHETER;  Surgeon: Katha Cabal, MD;  Location: [facility];  Service: Vascular;  Laterality: N/A;   CAPD REVISION  04/14/2021   CORONARY ARTERY BYPASS GRAFT  09/1995   3 vessels   DIALYSIS/PERMA CATHETER INSERTION N/A 02/07/2021   Procedure: DIALYSIS/PERMA CATHETER INSERTION;  Surgeon: Katha Cabal, MD;  Location: [place] [facility];  Service: Cardiovascular;  Laterality: N/A;   PERITONEAL CATHETER REMOVAL  05/21/2021   PERITONEAL CATHETER REMOVAL  05/20/2018    Social History Social History   Tobacco Use   Smoking status: Former    Types: Cigars   Smokeless tobacco: Never  Vaping Use   Vaping Use: Never used  Substance Use Topics   Alcohol use: No   Drug use: No    Family History Family History  Problem Relation Age of Onset   Other Mother    Hypertension Mother     Allergies  Allergen Reactions   Hydralazine Swelling    Swelling hands feet and ankles     REVIEW OF SYSTEMS (Negative unless checked)  Constitutional: [] Weight loss  [] Fever  [] Chills Cardiac: [] Chest pain   [] Chest pressure   [] Palpitations   [] Shortness of breath when laying flat   [] Shortness of breath with exertion. Vascular:  [] Pain in legs with walking   []   Pain in legs at rest  [] History of DVT   [] Phlebitis   [] Swelling in legs   [] Varicose veins   [] Non-healing ulcers Pulmonary:   [] Uses home oxygen   [] Productive cough   [] Hemoptysis   [] Wheeze  [] COPD   [] Asthma Neurologic:  [] Dizziness   [] Seizures   [] History of stroke   [] History of TIA  [] Aphasia   [] Vissual changes   [] Weakness or numbness in arm   [] Weakness or numbness in leg Musculoskeletal:   [] Joint swelling   [] Joint pain   [] Low back pain Hematologic:  [] Easy bruising  [] Easy bleeding   [] Hypercoagulable state    [] Anemic Gastrointestinal:  [] Diarrhea   [] Vomiting  [] Gastroesophageal reflux/heartburn   [] Difficulty swallowing. Genitourinary:  [x] Chronic kidney disease   [] Difficult urination  [] Frequent urination   [] Blood in urine Skin:  [] Rashes   [] Ulcers  Psychological:  [] History of anxiety   []  History of major depression.  Physical Examination  There were no vitals filed for this visit. There is no height or weight on file to calculate BMI. Gen: WD/WN, NAD Head: [place]/AT, No temporalis wasting.  Ear/Nose/Throat: Hearing grossly intact, nares w/o erythema or drainage Eyes: PER, EOMI, sclera nonicteric.  Neck: Supple, no gross masses or lesions.  No JVD.  Pulmonary:  Good air movement, no audible wheezing, no use of accessory muscles.  Cardiac: RRR, precordium non-hyperdynamic. Vascular:    left AV fistula good thrill and good bruit Vessel Right Left  Radial Palpable Palpable  Brachial Palpable Palpable  Gastrointestinal: soft, non-distended. No guarding/no peritoneal signs.  Musculoskeletal: M/S 5/5 throughout.  No deformity.  Neurologic: CN 2-12 intact. Pain and light touch intact in extremities.  Symmetrical.  Speech is fluent. Motor exam as listed above. Psychiatric: Judgment intact, Mood & affect appropriate for pt's clinical situation. Dermatologic: No rashes or ulcers noted.  No changes consistent with cellulitis.   CBC Lab Results  Component Value Date   WBC 7.5 07/28/2021   HGB 11.2 (L) 08/06/2021   HCT 33.0 (L) 08/06/2021   MCV 102.5 (H) 07/28/2021   PLT 192 07/28/2021    BMET    Component Value Date/Time   NA 134 (L) 08/06/2021 0729   K 5.3 (H) 08/06/2021 0729   CL 99 08/06/2021 0729   CO2 25 07/28/2021 0914   GLUCOSE 138 (H) 08/06/2021 0729   BUN 49 (H) 08/06/2021 0729   CREATININE 4.70 (H) 08/06/2021 0729   CALCIUM 8.2 (L) 07/28/2021 0914   GFRNONAA 10 (L) 07/28/2021 0914   GFRAA 9 (L) 04/01/2018 1415   CrCl cannot be calculated (Patient's most recent lab  result is older than the maximum 21 days allowed.).  COAG Lab Results  Component Value Date   INR 1.2 07/28/2021   INR 1.07 04/01/2018   INR 1.05 02/25/2018    Radiology No results found.   Assessment/Plan 1. End stage renal disease ([place]) Recommend:  The patient is doing well and currently has adequate dialysis access. The patient's dialysis center is not reporting any access issues. Flow pattern is stable when compared to the prior ultrasound.  The patient should have a duplex ultrasound of the dialysis access in 6 months. The patient will follow-up with me in the office after each ultrasound    - VAS [place] (AVF, AVG); Future  2. Type 2 diabetes mellitus with ESRD (end-stage renal disease) ([place]) Continue hypoglycemic medications as already ordered, these medications have been reviewed and there are no changes at this time.  Hgb  A1C to be monitored as already arranged by primary service   3. Primary hypertension Continue antihypertensive medications as already ordered, these medications have been reviewed and there are no changes at this time.   4. Atrial fibrillation and flutter ([place]) Continue antiarrhythmia medications as already ordered, these medications have been reviewed and there are no changes at this time.  Continue anticoagulation as ordered by Cardiology Service   5. Coronary artery disease of native artery of native heart with stable angina pectoris (HCC) Continue cardiac and antihypertensive medications as already ordered and reviewed, no changes at this time.  Continue statin as ordered and reviewed, no changes at this time  Nitrates PRN for chest pain     Hortencia Pilar, MD  11/24/2021 10:24 AM

## 2021-11-25 ENCOUNTER — Telehealth (INDEPENDENT_AMBULATORY_CARE_PROVIDER_SITE_OTHER): Payer: Self-pay

## 2021-11-25 NOTE — Telephone Encounter (Signed)
A fax was received from [facility] to schedule the patient for a permcath removal. Patient is schedule with Dr. Lucky Cowboy on 12/04/21 with a 9:30 am arrival time to the MM. Pre-procedure instructions will be faxed to [facility] per their request.

## 2021-11-30 ENCOUNTER — Encounter (INDEPENDENT_AMBULATORY_CARE_PROVIDER_SITE_OTHER): Payer: Self-pay | Admitting: Vascular Surgery

## 2021-12-04 ENCOUNTER — Encounter
Admission: RE | Disposition: A | Discharge status: Home / Self Care | Payer: Self-pay | Source: Ambulatory Visit | Attending: Vascular Surgery

## 2021-12-04 ENCOUNTER — Encounter: Payer: Self-pay | Admitting: Vascular Surgery

## 2021-12-04 ENCOUNTER — Ambulatory Visit
Admission: RE | Admit: 2021-12-04 | Discharge: 2021-12-04 | Disposition: A | Discharge status: Home / Self Care | Payer: Medicare Other | Source: Ambulatory Visit | Attending: Vascular Surgery | Admitting: Vascular Surgery

## 2021-12-04 ENCOUNTER — Other Ambulatory Visit (INDEPENDENT_AMBULATORY_CARE_PROVIDER_SITE_OTHER): Payer: Self-pay | Admitting: Nurse Practitioner

## 2021-12-04 DIAGNOSIS — E1122 Type 2 diabetes mellitus with diabetic chronic kidney disease: Secondary | ICD-10-CM | POA: Insufficient documentation

## 2021-12-04 DIAGNOSIS — Z4901 Encounter for fitting and adjustment of extracorporeal dialysis catheter: Secondary | ICD-10-CM | POA: Diagnosis present

## 2021-12-04 DIAGNOSIS — N186 End stage renal disease: Secondary | ICD-10-CM

## 2021-12-04 DIAGNOSIS — Z7901 Long term (current) use of anticoagulants: Secondary | ICD-10-CM | POA: Insufficient documentation

## 2021-12-04 DIAGNOSIS — I509 Heart failure, unspecified: Secondary | ICD-10-CM | POA: Diagnosis not present

## 2021-12-04 DIAGNOSIS — I4891 Unspecified atrial fibrillation: Secondary | ICD-10-CM | POA: Insufficient documentation

## 2021-12-04 DIAGNOSIS — I132 Hypertensive heart and chronic kidney disease with heart failure and with stage 5 chronic kidney disease, or end stage renal disease: Secondary | ICD-10-CM | POA: Insufficient documentation

## 2021-12-04 DIAGNOSIS — I25118 Atherosclerotic heart disease of native coronary artery with other forms of angina pectoris: Secondary | ICD-10-CM | POA: Insufficient documentation

## 2021-12-04 DIAGNOSIS — I4892 Unspecified atrial flutter: Secondary | ICD-10-CM | POA: Diagnosis not present

## 2021-12-04 DIAGNOSIS — Z992 Dependence on renal dialysis: Secondary | ICD-10-CM

## 2021-12-04 HISTORY — PX: DIALYSIS/PERMA CATHETER REMOVAL: CATH118289

## 2021-12-04 SURGERY — DIALYSIS/PERMA CATHETER REMOVAL
Anesthesia: LOCAL

## 2021-12-04 SURGICAL SUPPLY — 4 items
CHLORAPREP W/TINT 26 (MISCELLANEOUS) ×2 IMPLANT
FORCEPS HALSTEAD CVD 5IN STRL (INSTRUMENTS) ×1 IMPLANT
SCALPEL PROTECTED #11 DISP (BLADE) ×1 IMPLANT
TRAY LACERAT/PLASTIC (MISCELLANEOUS) ×1 IMPLANT

## 2021-12-04 NOTE — Op Note (Signed)
Operative Note     Preoperative diagnosis:   1. ESRD with functional permanent access  Postoperative diagnosis:  1. ESRD with functional permanent access  Procedure:  Removal of right jugular Permcath  Surgeon:  Leotis Pain, MD  Anesthesia:  Local  EBL:  Minimal  Indication for the Procedure:  The patient has a functional permanent dialysis access and no longer needs their permcath.  This can be removed.  Risks and benefits are discussed and informed consent is obtained.  Description of the Procedure:  The patient's right neck, chest and existing catheter were sterilely prepped and draped. The area around the catheter was anesthetized copiously with 1% lidocaine. The catheter was dissected out with curved hemostats until the cuff was freed from the surrounding fibrous sheath. The fiber sheath was transected, and the catheter was then removed in its entirety using gentle traction. Pressure was held and sterile dressings were placed. The patient tolerated the procedure well and was taken to the recovery room in stable condition.     Leotis Pain  12/04/2021, 10:13 AM This note was created with Dragon Medical transcription system. Any errors in dictation are purely unintentional.

## 2021-12-04 NOTE — Discharge Instructions (Signed)
Tunneled Catheter Removal, Care After Refer to this sheet in the next few weeks. These instructions provide you with information about caring for yourself after your procedure. Your health care provider may also give you more specific instructions. Your treatment has been planned according to current medical practices, but problems sometimes occur. Call your health care provider if you have any problems or questions after your procedure. What can I expect after the procedure? After the procedure, it is common to have: Some mild redness, swelling, and pain around your catheter site.   Follow these instructions at home: Incision care  Check your removal site  every day for signs of infection. Check for: More redness, swelling, or pain. More fluid or blood. Warmth. Pus or a bad smell. Remove your dressing in 48hrs leave open to air  Activity  Return to your normal activities as told by your health care provider. Ask your health care provider what activities are safe for you. Do not lift anything that is heavier than 10 lb (4.5 kg) for 3 days  You may shower tomorrow  Contact a health care provider if: You have more fluid or blood coming from your removal site You have more redness, swelling, or pain at your incisions or around the area where your catheter was removed Your removal site feel warm to the touch. You feel unusually weak. You feel nauseous.. Get help right away if You have swelling in your arm, shoulder, neck, or face. You develop chest pain. You have difficulty breathing. You feel dizzy or light-headed. You have pus or a bad smell coming from your removal site You have a fever. You develop bleeding from your removal site, and your bleeding does not stop. This information is not intended to replace advice given to you by your health care provider. Make sure you discuss any questions you have with your health care provider. Document Released: 11/23/2012 Document Revised:  08/09/2016 Document Reviewed: 09/02/2015 Elsevier Interactive Patient Education  2017 [employer] 

## 2021-12-04 NOTE — Interval H&P Note (Signed)
History and Physical Interval Note:  12/04/2021 9:57 AM  Wesley Henderson  has presented today for surgery, with the diagnosis of Perm Cath Removal   ESRD.  The various methods of treatment have been discussed with the patient and family. After consideration of risks, benefits and other options for treatment, the patient has consented to  Procedure(s): DIALYSIS/PERMA CATHETER REMOVAL (N/A) as a surgical intervention.  The patient's history has been reviewed, patient examined, no change in status, stable for surgery.  I have reviewed the patient's chart and labs.  Questions were answered to the patient's satisfaction.     Leotis Pain

## 2022-01-23 ENCOUNTER — Other Ambulatory Visit: Payer: Self-pay | Admitting: Internal Medicine

## 2022-01-23 DIAGNOSIS — J90 Pleural effusion, not elsewhere classified: Secondary | ICD-10-CM

## 2022-01-27 ENCOUNTER — Other Ambulatory Visit: Payer: Self-pay | Admitting: Student

## 2022-01-27 ENCOUNTER — Ambulatory Visit
Admission: RE | Admit: 2022-01-27 | Discharge: 2022-01-27 | Disposition: A | Discharge status: Home / Self Care | Payer: Medicare Other | Source: Ambulatory Visit | Attending: Internal Medicine | Admitting: Internal Medicine

## 2022-01-27 ENCOUNTER — Ambulatory Visit
Admission: RE | Admit: 2022-01-27 | Discharge: 2022-01-27 | Disposition: A | Discharge status: Home / Self Care | Payer: Medicare Other | Source: Ambulatory Visit | Attending: Student | Admitting: Student

## 2022-01-27 DIAGNOSIS — Z9889 Other specified postprocedural states: Secondary | ICD-10-CM | POA: Insufficient documentation

## 2022-01-27 DIAGNOSIS — J90 Pleural effusion, not elsewhere classified: Secondary | ICD-10-CM

## 2022-01-27 NOTE — Procedures (Signed)
PROCEDURE SUMMARY:  Successful US guided right thoracentesis. Yielded 1.9 L of amber-colored fluid. Pt tolerated procedure well. No immediate complications.  CXR ordered; no post-procedure pneumothorax identified  EBL < 2 mL  Theresa Duty, NP 01/27/2022 3:45 PM

## 2022-02-27 ENCOUNTER — Other Ambulatory Visit: Payer: Self-pay | Admitting: Nephrology

## 2022-02-27 DIAGNOSIS — R062 Wheezing: Secondary | ICD-10-CM

## 2022-02-27 DIAGNOSIS — N186 End stage renal disease: Secondary | ICD-10-CM

## 2022-02-27 DIAGNOSIS — J90 Pleural effusion, not elsewhere classified: Secondary | ICD-10-CM

## 2022-02-27 DIAGNOSIS — Z992 Dependence on renal dialysis: Secondary | ICD-10-CM

## 2022-03-02 ENCOUNTER — Ambulatory Visit
Admission: RE | Admit: 2022-03-02 | Discharge: 2022-03-02 | Disposition: A | Discharge status: Home / Self Care | Payer: Medicare Other | Source: Ambulatory Visit | Attending: Nephrology | Admitting: Nephrology

## 2022-03-02 ENCOUNTER — Ambulatory Visit
Admission: RE | Admit: 2022-03-02 | Discharge: 2022-03-02 | Disposition: A | Discharge status: Home / Self Care | Payer: Medicare Other | Attending: Nephrology | Admitting: Nephrology

## 2022-03-02 DIAGNOSIS — N186 End stage renal disease: Secondary | ICD-10-CM | POA: Insufficient documentation

## 2022-03-02 DIAGNOSIS — Z992 Dependence on renal dialysis: Secondary | ICD-10-CM | POA: Insufficient documentation

## 2022-03-02 DIAGNOSIS — J90 Pleural effusion, not elsewhere classified: Secondary | ICD-10-CM | POA: Insufficient documentation

## 2022-03-02 DIAGNOSIS — R062 Wheezing: Secondary | ICD-10-CM

## 2022-03-11 ENCOUNTER — Encounter: Payer: Self-pay | Admitting: Nephrology

## 2022-03-13 ENCOUNTER — Ambulatory Visit
Admission: RE | Admit: 2022-03-13 | Discharge: 2022-03-13 | Disposition: A | Discharge status: Home / Self Care | Payer: Medicare Other | Source: Ambulatory Visit | Attending: Internal Medicine | Admitting: Internal Medicine

## 2022-03-13 ENCOUNTER — Other Ambulatory Visit: Payer: Self-pay | Admitting: Internal Medicine

## 2022-03-13 DIAGNOSIS — J90 Pleural effusion, not elsewhere classified: Secondary | ICD-10-CM | POA: Diagnosis present

## 2022-03-13 DIAGNOSIS — Z9889 Other specified postprocedural states: Secondary | ICD-10-CM | POA: Insufficient documentation

## 2022-03-13 NOTE — Procedures (Signed)
PROCEDURE SUMMARY: ? ?Successful US guided right thoracentesis. ?Yielded 1.7 L of amber-colored fluid. ?Pt tolerated procedure well. ?No immediate complications. ? ?CXR ordered; no post-procedure pneumothorax identified ? ?EBL < 2 mL ? ?Theresa Duty, NP ?03/13/2022 ?2:24 PM ? ? ? ?

## 2022-03-19 ENCOUNTER — Encounter (INDEPENDENT_AMBULATORY_CARE_PROVIDER_SITE_OTHER): Payer: Medicare Other

## 2022-03-19 ENCOUNTER — Ambulatory Visit (INDEPENDENT_AMBULATORY_CARE_PROVIDER_SITE_OTHER): Payer: Medicare Other | Admitting: Vascular Surgery

## 2022-04-06 ENCOUNTER — Other Ambulatory Visit: Payer: Self-pay

## 2022-04-06 ENCOUNTER — Other Ambulatory Visit: Payer: Self-pay | Admitting: Student

## 2022-04-06 ENCOUNTER — Ambulatory Visit
Admission: RE | Admit: 2022-04-06 | Discharge: 2022-04-06 | Disposition: A | Discharge status: Home / Self Care | Payer: Medicare Other | Source: Ambulatory Visit | Attending: Student | Admitting: Student

## 2022-04-06 ENCOUNTER — Observation Stay
Admission: EM | Admit: 2022-04-06 | Discharge: 2022-04-07 | Disposition: A | Discharge status: Home / Self Care | Payer: Medicare Other | Attending: Internal Medicine | Admitting: Internal Medicine

## 2022-04-06 ENCOUNTER — Ambulatory Visit
Admission: RE | Admit: 2022-04-06 | Discharge: 2022-04-06 | Disposition: A | Discharge status: Home / Self Care | Payer: Medicare Other | Source: Home / Self Care | Attending: Student | Admitting: Student

## 2022-04-06 DIAGNOSIS — I482 Chronic atrial fibrillation, unspecified: Secondary | ICD-10-CM | POA: Diagnosis not present

## 2022-04-06 DIAGNOSIS — Z20822 Contact with and (suspected) exposure to covid-19: Secondary | ICD-10-CM | POA: Diagnosis not present

## 2022-04-06 DIAGNOSIS — Z79899 Other long term (current) drug therapy: Secondary | ICD-10-CM | POA: Insufficient documentation

## 2022-04-06 DIAGNOSIS — I132 Hypertensive heart and chronic kidney disease with heart failure and with stage 5 chronic kidney disease, or end stage renal disease: Secondary | ICD-10-CM | POA: Insufficient documentation

## 2022-04-06 DIAGNOSIS — Z992 Dependence on renal dialysis: Secondary | ICD-10-CM | POA: Diagnosis not present

## 2022-04-06 DIAGNOSIS — E1122 Type 2 diabetes mellitus with diabetic chronic kidney disease: Secondary | ICD-10-CM | POA: Diagnosis not present

## 2022-04-06 DIAGNOSIS — Z87891 Personal history of nicotine dependence: Secondary | ICD-10-CM | POA: Insufficient documentation

## 2022-04-06 DIAGNOSIS — I5032 Chronic diastolic (congestive) heart failure: Secondary | ICD-10-CM | POA: Insufficient documentation

## 2022-04-06 DIAGNOSIS — J90 Pleural effusion, not elsewhere classified: Principal | ICD-10-CM

## 2022-04-06 DIAGNOSIS — E43 Unspecified severe protein-calorie malnutrition: Secondary | ICD-10-CM | POA: Insufficient documentation

## 2022-04-06 DIAGNOSIS — I10 Essential (primary) hypertension: Secondary | ICD-10-CM | POA: Diagnosis present

## 2022-04-06 DIAGNOSIS — J810 Acute pulmonary edema: Secondary | ICD-10-CM | POA: Insufficient documentation

## 2022-04-06 DIAGNOSIS — N186 End stage renal disease: Secondary | ICD-10-CM | POA: Diagnosis not present

## 2022-04-06 DIAGNOSIS — Z951 Presence of aortocoronary bypass graft: Secondary | ICD-10-CM | POA: Diagnosis not present

## 2022-04-06 DIAGNOSIS — I2510 Atherosclerotic heart disease of native coronary artery without angina pectoris: Secondary | ICD-10-CM | POA: Diagnosis not present

## 2022-04-06 DIAGNOSIS — N189 Chronic kidney disease, unspecified: Secondary | ICD-10-CM | POA: Diagnosis present

## 2022-04-06 DIAGNOSIS — Z7982 Long term (current) use of aspirin: Secondary | ICD-10-CM | POA: Insufficient documentation

## 2022-04-06 DIAGNOSIS — I4891 Unspecified atrial fibrillation: Secondary | ICD-10-CM | POA: Diagnosis present

## 2022-04-06 DIAGNOSIS — R0602 Shortness of breath: Secondary | ICD-10-CM | POA: Diagnosis present

## 2022-04-06 LAB — CBC
HCT: 36.2 % — ABNORMAL LOW (ref 39.0–52.0)
Hemoglobin: 11.7 g/dL — ABNORMAL LOW (ref 13.0–17.0)
MCH: 32.3 pg (ref 26.0–34.0)
MCHC: 32.3 g/dL (ref 30.0–36.0)
MCV: 100 fL (ref 80.0–100.0)
Platelets: 270 10*3/uL (ref 150–400)
RBC: 3.62 MIL/uL — ABNORMAL LOW (ref 4.22–5.81)
RDW: 15.7 % — ABNORMAL HIGH (ref 11.5–15.5)
WBC: 8.9 10*3/uL (ref 4.0–10.5)
nRBC: 0 % (ref 0.0–0.2)

## 2022-04-06 LAB — COMPREHENSIVE METABOLIC PANEL
ALT: 17 U/L (ref 0–44)
AST: 19 U/L (ref 15–41)
Albumin: 3.1 g/dL — ABNORMAL LOW (ref 3.5–5.0)
Alkaline Phosphatase: 114 U/L (ref 38–126)
Anion gap: 10 (ref 5–15)
BUN: 42 mg/dL — ABNORMAL HIGH (ref 8–23)
CO2: 27 mmol/L (ref 22–32)
Calcium: 7.9 mg/dL — ABNORMAL LOW (ref 8.9–10.3)
Chloride: 97 mmol/L — ABNORMAL LOW (ref 98–111)
Creatinine, Ser: 3.63 mg/dL — ABNORMAL HIGH (ref 0.61–1.24)
GFR, Estimated: 15 mL/min — ABNORMAL LOW (ref >60)
Glucose, Bld: 174 mg/dL — ABNORMAL HIGH (ref 70–99)
Potassium: 4.2 mmol/L (ref 3.5–5.1)
Sodium: 134 mmol/L — ABNORMAL LOW (ref 135–145)
Total Bilirubin: 0.7 mg/dL (ref 0.3–1.2)
Total Protein: 7.3 g/dL (ref 6.5–8.1)

## 2022-04-06 LAB — RESP PANEL BY RT-PCR (FLU A&B, COVID) ARPGX2
Influenza A by PCR: NEGATIVE
Influenza B by PCR: NEGATIVE
SARS Coronavirus 2 by RT PCR: NEGATIVE

## 2022-04-06 LAB — BRAIN NATRIURETIC PEPTIDE: B Natriuretic Peptide: 1119.3 pg/mL — ABNORMAL HIGH (ref 0.0–100.0)

## 2022-04-06 LAB — APTT: aPTT: 32 seconds (ref 24–36)

## 2022-04-06 MED ORDER — FUROSEMIDE 10 MG/ML IJ SOLN
80.0000 mg | Freq: Once | INTRAMUSCULAR | Status: AC
  Administered 2022-04-06: 80 mg via INTRAVENOUS
  Filled 2022-04-06: qty 8

## 2022-04-06 MED ORDER — SIMVASTATIN 20 MG PO TABS
20.0000 mg | ORAL_TABLET | Freq: Every day | ORAL | Status: DC
  Administered 2022-04-06: 20 mg via ORAL
  Filled 2022-04-06: qty 1

## 2022-04-06 MED ORDER — ASPIRIN EC 81 MG PO TBEC
81.0000 mg | DELAYED_RELEASE_TABLET | Freq: Every day | ORAL | Status: DC
  Administered 2022-04-07: 81 mg via ORAL
  Filled 2022-04-06: qty 1

## 2022-04-06 MED ORDER — MELATONIN 5 MG PO TABS
5.0000 mg | ORAL_TABLET | Freq: Every evening | ORAL | Status: DC | PRN
  Administered 2022-04-06: 5 mg via ORAL
  Filled 2022-04-06: qty 1

## 2022-04-06 MED ORDER — APIXABAN 2.5 MG PO TABS
2.5000 mg | ORAL_TABLET | Freq: Two times a day (BID) | ORAL | Status: DC
  Filled 2022-04-06: qty 1

## 2022-04-06 MED ORDER — TORSEMIDE 20 MG PO TABS
40.0000 mg | ORAL_TABLET | Freq: Every day | ORAL | Status: DC
  Administered 2022-04-07: 40 mg via ORAL
  Filled 2022-04-06: qty 2

## 2022-04-06 MED ORDER — CARVEDILOL 6.25 MG PO TABS
12.5000 mg | ORAL_TABLET | Freq: Two times a day (BID) | ORAL | Status: DC
  Filled 2022-04-06: qty 2

## 2022-04-06 NOTE — Consult Note (Signed)
ANTICOAGULATION CONSULT NOTE - Initial Consult ? ?Pharmacy Consult for apixaban ?Indication: atrial fibrillation ? ?Allergies  ?Allergen Reactions  ? Hydralazine Swelling  ?  Swelling hands feet and ankles  ? ? ?Patient Measurements: ?Height: 5\' 8"  (172.7 cm) ?Weight: 78 kg (171 lb 15.3 oz) ?IBW/kg (Calculated) : 68.4 ? ? ?Vital Signs: ?Temp: 98.3 ?F (36.8 ?C) (04/17 1516) ?Temp Source: Oral (04/17 1516) ?BP: 146/73 (04/17 1700) ?Pulse Rate: 72 (04/17 1700) ? ?Labs: ?Recent Labs  ?  04/06/22 ?1523  ?HGB 11.7*  ?HCT 36.2*  ?PLT 270  ?CREATININE 3.63*  ? ? ?Estimated Creatinine Clearance: 13.1 mL/min (A) (by C-G formula based on SCr of 3.63 mg/dL (H)). ? ? ?Medical History: ?Past Medical History:  ?Diagnosis Date  ? Anemia of chronic renal disease   ? Anxiety   ? CAD (coronary artery disease)   ? CHF (congestive heart failure) ([place])   ? Diabetic glomerulopathy ([place])   ? ESRD (end stage renal disease) on dialysis [facility])   ? HD on MWF  ? 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 3 vessel  ? Hyperlipidemia   ? Hypertension   ? Secondary hyperparathyroidism of renal origin [facility])   ? Type II diabetes mellitus with nephropathy ([place])   ? ? ?Medications:  ?Eliquis 2.5 mg BID ? ?Assessment: ?88 y.o. male with medical history significant for recurrent pleural effusion, esrd on mwf hemodialysis, a fib on eliquis presented to [facility] 04/06/22 with pleural effusion. Pharmacy has been consulted to resume home eliquis.  ? ?Goal of Therapy:  ?Monitor platelets by anticoagulation protocol: Yes ?  ?Plan:  ?Resume home apixaban 2.5 mg BID  ?Age > 80 and Scr > 1.5 ?CBC at least every 3 days per protocol  ? ?Dorothe Pea, PharmD, BCPS ?Clinical Pharmacist   ?04/06/2022,5:55 PM ? ? ?

## 2022-04-06 NOTE — ED Triage Notes (Signed)
Pt here with SOB. Pt was told that he has fluid on his lungs. Pt has audible wheezing with ambulation to triage room. Pt denies pain. ?

## 2022-04-06 NOTE — H&P (Addendum)
?History and Physical  ? ? ?Wesley Henderson ZWC:[id number] DOB: 01-13-1930 DOA: 04/06/2022 ? ?PCP: [name], MD  ?Patient coming from: home ? ? ?Chief Complaint: dyspnea ? ?HPI: Wesley Henderson is a 88 y.o. male with medical history significant for recurrent pleural effusion, esrd on mwf hemodialysis, t2dm not on meds, cad s/p cagt, dCHF, a fib on doac, here for the above. ? ?Has had recurrent dyspnea for about 6 months. Outpatient has been diagnosed with recurrent pleural effusion, managed by his pcp and nephrologist. He has had 2 thoracenteses, last was one month ago. He reports one week of gradually worsening dyspnea on exertion and sob. No pain. Occasional cough.  ? ?ED Course:  ? ?CXR shows significant right sided pleural effusion ? ?Review of Systems: As per HPI otherwise 10 point review of systems negative.  ? ? ?Past Medical History:  ?Diagnosis Date  ? Anemia of chronic renal disease   ? Anxiety   ? CAD (coronary artery disease)   ? CHF (congestive heart failure) ([place])   ? Diabetic glomerulopathy ([place])   ? ESRD (end stage renal disease) on dialysis [facility])   ? HD on MWF  ? 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 3 vessel  ? Hyperlipidemia   ? Hypertension   ? Secondary hyperparathyroidism of renal origin [facility])   ? Type II diabetes mellitus with nephropathy ([place])   ? ? ?Past Surgical History:  ?Procedure Laterality Date  ? AV FISTULA PLACEMENT Left 08/06/2021  ? Procedure: ARTERIOVENOUS (AV) FISTULA CREATION (BRACHIALCEPHALIC);  Surgeon: Katha Cabal, MD;  Location: [facility] ORS;  Service: Vascular;  Laterality: Left;  ? CAPD INSERTION N/A 03/04/2018  ? Procedure: LAPAROSCOPIC INSERTION CONTINUOUS AMBULATORY PERITONEAL DIALYSIS  (CAPD) CATHETER;  Surgeon: Katha Cabal, MD;  Location: [facility] ORS;  Service: Vascular;  Laterality: N/A;  ? CAPD REVISION N/A 04/06/2018  ? Procedure: LAPAROSCOPIC REVISION CONTINUOUS AMBULATORY PERITONEAL DIALYSIS  (CAPD) CATHETER;  Surgeon: Katha Cabal, MD;  Location: [facility] ORS;   Service: Vascular;  Laterality: N/A;  ? CAPD REVISION  04/14/2021  ? CORONARY ARTERY BYPASS GRAFT  09/1995  ? 3 vessels  ? DIALYSIS/PERMA CATHETER INSERTION N/A 02/07/2021  ? Procedure: DIALYSIS/PERMA CATHETER INSERTION;  Surgeon: Katha Cabal, MD;  Location: [place] [facility];  Service: Cardiovascular;  Laterality: N/A;  ? DIALYSIS/PERMA CATHETER REMOVAL N/A 12/04/2021  ? Procedure: DIALYSIS/PERMA CATHETER REMOVAL;  Surgeon: Algernon Huxley, MD;  Location: [place] [facility];  Service: Cardiovascular;  Laterality: N/A;  ? PERITONEAL CATHETER REMOVAL  05/21/2021  ? PERITONEAL CATHETER REMOVAL  05/20/2018  ? ? ? reports that he has quit smoking. His smoking use included cigars. He has never used smokeless tobacco. He reports that he does not drink alcohol and does not use drugs. ? ?Allergies  ?Allergen Reactions  ? Hydralazine Swelling  ?  Swelling hands feet and ankles  ? ? ?Family History  ?Problem Relation Age of Onset  ? Other Mother   ? Hypertension Mother   ? ? ?Prior to Admission medications   ?Medication Sig Start Date End Date Taking? Authorizing Provider  ?albuterol (PROVENTIL) (2.5 MG/3ML) 0.083% nebulizer solution Take 2.5 mg by nebulization every 6 (six) hours as needed for wheezing or shortness of breath.    [provider]  ?albuterol (VENTOLIN HFA) 108 (90 Base) MCG/ACT inhaler Inhale 2 puffs into the lungs every 6 (six) hours as needed for wheezing or shortness of breath. 02/10/21   [name], MD  ?apixaban [place])  2.5 MG TABS tablet Take by mouth. 08/26/21   [provider]  ?aspirin EC 81 MG tablet Take 81 mg by mouth daily.    [provider]  ?bisacodyl (DULCOLAX) 5 MG EC tablet Take 5 mg by mouth daily as needed for moderate constipation.    [provider]  ?Carboxymethylcellul-Glycerin (CLEAR EYES FOR DRY EYES) 1-0.25 % SOLN Place 1 drop into both eyes daily as needed (dry eyes).    [provider]  ?carvedilol (COREG) 12.5 MG tablet  Take 12.5 mg by mouth 2 (two) times daily with a meal.  04/27/17   [provider]  ?HYDROcodone-acetaminophen (NORCO) 5-325 MG tablet Take 1 tablet by mouth every 6 (six) hours as needed for moderate pain or severe pain. 08/06/21   Schnier, Dolores Lory, MD  ?Menthol, Topical Analgesic, (BIOFREEZE EX) Apply 1 application topically daily as needed (pain).    [provider]  ?polyethylene glycol (MIRALAX / GLYCOLAX) 17 g packet Take 17 g by mouth daily as needed. 02/10/21   [name], MD  ?psyllium (HYDROCIL/METAMUCIL) 95 % PACK Take 1 packet by mouth daily.    [provider]  ?simvastatin (ZOCOR) 20 MG tablet Take 20 mg by mouth at bedtime.     [provider]  ?torsemide (DEMADEX) 20 MG tablet Take 40 mg by mouth daily.    [provider]  ? ? ?Physical Exam: ?Vitals:  ? 04/06/22 1517 04/06/22 1600 04/06/22 1630 04/06/22 1700  ?BP: (!) 157/68 (!) 147/62 (!) 152/57 (!) 146/73  ?Pulse:  77 84 72  ?Resp:   20 (!) 23  ?Temp:      ?TempSrc:      ?SpO2:  95% 96% 99%  ?Weight: 78 kg     ?Height: 5\' 8"  (1.727 m)     ? ? ?Constitutional: No acute distress ?Head: Atraumatic ?Eyes: Conjunctiva clear ?ENM: Moist mucous membranes. Normal dentition.  ?Neck: Supple ?Respiratory: mild tachypnea w/ use of accessory muscles, decreased sounds throughout right  ?Cardiovascular: irreg irreg No murmurs/rubs/gallops. ?Abdomen: Non-tender, non-distended. No masses. No rebound or guarding. Positive bowel sounds. ?Musculoskeletal: No joint deformity upper and lower extremities. Normal ROM, no contractures. Normal muscle tone.  ?Skin: No rashes, lesions, or ulcers.  ?Extremities: No peripheral edema. Palpable peripheral pulses. ?Neurologic: Alert, moving all 4 extremities. ?Psychiatric: Normal insight and judgement. ? ? ?Labs on Admission: I have personally reviewed following labs and imaging studies ? ?CBC: ?Recent Labs  ?Lab 04/06/22 ?1523  ?WBC 8.9  ?HGB 11.7*  ?HCT 36.2*  ?MCV 100.0  ?PLT  270  ? ?Basic Metabolic Panel: ?Recent Labs  ?Lab 04/06/22 ?1523  ?NA 134*  ?K 4.2  ?CL 97*  ?CO2 27  ?GLUCOSE 174*  ?BUN 42*  ?CREATININE 3.63*  ?CALCIUM 7.9*  ? ?GFR: ?Estimated Creatinine Clearance: 13.1 mL/min (A) (by C-G formula based on SCr of 3.63 mg/dL (H)). ?Liver Function Tests: ?Recent Labs  ?Lab 04/06/22 ?1523  ?AST 19  ?ALT 17  ?ALKPHOS 114  ?BILITOT 0.7  ?PROT 7.3  ?ALBUMIN 3.1*  ? ?No results for input(s): LIPASE, AMYLASE in the last 168 hours. ?No results for input(s): AMMONIA in the last 168 hours. ?Coagulation Profile: ?No results for input(s): INR, PROTIME in the last 168 hours. ?Cardiac Enzymes: ?No results for input(s): CKTOTAL, CKMB, CKMBINDEX, TROPONINI in the last 168 hours. ?BNP (last 3 results) ?No results for input(s): PROBNP in the last 8760 hours. ?HbA1C: ?No results for input(s): HGBA1C in the last 72 hours. ?CBG: ?No results  for input(s): GLUCAP in the last 168 hours. ?Lipid Profile: ?No results for input(s): CHOL, HDL, LDLCALC, TRIG, CHOLHDL, LDLDIRECT in the last 72 hours. ?Thyroid Function Tests: ?No results for input(s): TSH, T4TOTAL, FREET4, T3FREE, THYROIDAB in the last 72 hours. ?Anemia Panel: ?No results for input(s): VITAMINB12, FOLATE, FERRITIN, TIBC, IRON, RETICCTPCT in the last 72 hours. ?Urine analysis: ?   ?Component Value Date/Time  ? COLORURINE YELLOW (A) 02/05/2021 1410  ? APPEARANCEUR CLEAR (A) 02/05/2021 1410  ? LABSPEC 1.008 02/05/2021 1410  ? PHURINE 8.0 02/05/2021 1410  ? GLUCOSEU NEGATIVE 02/05/2021 1410  ? HGBUR MODERATE (A) 02/05/2021 1410  ? [place] NEGATIVE 02/05/2021 1410  ? [place] NEGATIVE 02/05/2021 1410  ? PROTEINUR NEGATIVE 02/05/2021 1410  ? NITRITE NEGATIVE 02/05/2021 1410  ? LEUKOCYTESUR NEGATIVE 02/05/2021 1410  ? ? ?Radiological Exams on Admission: ?DG Chest 2 View ? ?Result Date: 04/06/2022 ?CLINICAL DATA:  Acute pulmonary edema (HCC) EXAM: CHEST - 2 VIEW COMPARISON:  03/13/2022 FINDINGS: Cardiomediastinal silhouette and pulmonary vasculature  are within normal limits. Postsurgical changes of CABG again seen. Interval worsening of right pleural effusion with near complete opacification of the right hemithorax. Left lung remains well aerated.

## 2022-04-06 NOTE — ED Notes (Signed)
Pt had a chest x ray this morning. ?

## 2022-04-06 NOTE — ED Notes (Signed)
Pt transported to admit bed on monitor with RN.  ?

## 2022-04-06 NOTE — Progress Notes (Signed)
?   04/06/22 2001  ?Assess: MEWS Score  ?Temp 97.9 ?F (36.6 ?C)  ?BP 138/84  ?Pulse Rate 82  ?Resp (!) 38  ?SpO2 98 %  ?O2 Device Room Air  ?Assess: MEWS Score  ?MEWS Temp 0  ?MEWS Systolic 0  ?MEWS Pulse 0  ?MEWS RR 3  ?MEWS LOC 0  ?MEWS Score 3  ?MEWS Score Color Yellow  ?Assess: if the MEWS score is Yellow or Red  ?Were vital signs taken at a resting state? Yes  ?Focused Assessment No change from prior assessment  ?Does the patient meet 2 or more of the SIRS criteria? No  ?MEWS guidelines implemented *See Row Information* No, previously yellow, continue vital signs every 4 hours  ?Treat  ?Pain Scale 0-10  ?Pain Score 0  ?Notify: Charge Nurse/RN  ?Name of Charge Nurse/RN Notified Kennyth Lose, RN  ?Date Charge Nurse/RN Notified 04/06/22  ?Time Charge Nurse/RN Notified 2001  ?Assess: SIRS CRITERIA  ?SIRS Temperature  0  ?SIRS Pulse 0  ?SIRS Respirations  1  ?SIRS WBC 0  ?SIRS Score Sum  1  ? ? ?

## 2022-04-06 NOTE — ED Provider Notes (Signed)
? ?[facility] ?Provider Note ? ? ? Event Date/Time  ? First MD Initiated Contact with Patient 04/06/22 1529   ?  (approximate) ? ? ?History  ? ?Shortness of Breath ? ? ?HPI ? ?Wesley Henderson is a 88 y.o. male with past medical history of end-stage renal disease, congestive heart failure, coronary disease, 2 diabetes, hyperlipidemia,  and hypertension who presents with shortness of breath.  Patient has had progressive dyspnea over the last several weeks.  Had difficulty sleeping and has had some cough.  Denies chest pain fevers chills.  Has chronic lower extreme edema.  He is compliant with dialysis.  Has required this for right-sided pleural effusion twice now on 2/7 and 3/24.  Patient saw primary care today who ordered an x-ray which was concerning for worsening of the right pleural effusion so he was sent to the ED.  Patient does make some urine. ?  ? ?Past Medical History:  ?Diagnosis Date  ? Anemia of chronic renal disease   ? Anxiety   ? CAD (coronary artery disease)   ? CHF (congestive heart failure) ([place])   ? Diabetic glomerulopathy ([place])   ? ESRD (end stage renal disease) on dialysis [facility])   ? HD on MWF  ? 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 3 vessel  ? Hyperlipidemia   ? Hypertension   ? Secondary hyperparathyroidism of renal origin [facility])   ? Type II diabetes mellitus with nephropathy ([place])   ? ? ?Patient Active Problem List  ? Diagnosis Date Noted  ? Atrial fibrillation and flutter ([place]) 08/26/2021  ? Hypertension 06/26/2021  ? Type 2 diabetes mellitus with ESRD (end-stage renal disease) ([place]) 04/21/2021  ? Acute congestive heart failure ([place])   ? Wheeze   ? End stage renal disease ([place])   ? Weakness   ? Hyperkalemia 02/05/2021  ? Secondary hyperparathyroidism of renal origin ([place]) 11/30/2019  ? Anemia in chronic kidney disease ([place]) 06/09/2018  ? Anemia of chronic renal failure 06/09/2018  ? ESRD on peritoneal dialysis ([place]) 05/14/2018  ? Chronic kidney disease 02/14/2018  ? Essential  hypertension 02/14/2018  ? Hyperlipidemia 02/14/2018  ? Coronary artery disease 02/14/2018  ? Diabetes mellitus with ESRD (end-stage renal disease) ([place]) 06/11/2016  ? Anemia of chronic disease 06/11/2016  ? Diabetic glomerulopathy ([place]) 06/11/2016  ? Proteinuria 08/30/2014  ? Hyperlipidemia, unspecified 08/30/2014  ? ? ? ?Physical Exam  ?Triage Vital Signs: ?ED Triage Vitals  ?Enc Vitals Group  ?   BP 04/06/22 1517 (!) 157/68  ?   Pulse Rate 04/06/22 1516 93  ?   Resp 04/06/22 1516 20  ?   Temp 04/06/22 1516 98.3 ?F (36.8 ?C)  ?   Temp Source 04/06/22 1516 Oral  ?   SpO2 04/06/22 1516 95 %  ?   Weight 04/06/22 1517 171 lb 15.3 oz (78 kg)  ?   Height 04/06/22 1517 5\' 8"  (1.727 m)  ?   Head Circumference --   ?   Peak Flow --   ?   Pain Score 04/06/22 1517 0  ?   Pain Loc --   ?   Pain Edu? --   ?   Excl. in [place]? --   ? ? ?Most recent vital signs: ?Vitals:  ? 04/06/22 1600 04/06/22 1630  ?BP: (!) 147/62 (!) 152/57  ?Pulse: 77 84  ?Resp:  20  ?Temp:    ?SpO2: 95% 96%  ? ? ? ?General: Awake, no distress.  ?CV:  Good  peripheral perfusion. 1+ edema bilaterally ?Resp:  Patient is tachypneic but able to speak in full sentences, decreased breath sounds on the right ?Abd:  No distention.  ?Neuro:             Awake, Alert, Oriented x 3  ?Other:   ? ? ?ED Results / Procedures / Treatments  ?Labs ?(all labs ordered are listed, but only abnormal results are displayed) ?Labs Reviewed  ?CBC - Abnormal; Notable for the following components:  ?    Result Value  ? RBC 3.62 (*)   ? Hemoglobin 11.7 (*)   ? HCT 36.2 (*)   ? RDW 15.7 (*)   ? All other components within normal limits  ?COMPREHENSIVE METABOLIC PANEL - Abnormal; Notable for the following components:  ? Sodium 134 (*)   ? Chloride 97 (*)   ? Glucose, Bld 174 (*)   ? BUN 42 (*)   ? Creatinine, Ser 3.63 (*)   ? Calcium 7.9 (*)   ? Albumin 3.1 (*)   ? GFR, Estimated 15 (*)   ? All other components within normal limits  ?BRAIN NATRIURETIC PEPTIDE - Abnormal; Notable for the  following components:  ? B Natriuretic Peptide 1,119.3 (*)   ? All other components within normal limits  ? ? ? ?EKG ? ?Atrial fibrillation with right bundle branch block and PVCs, no acute ischemic changes ? ?RADIOLOGY ?I reviewed the x-ray image performed earlier today which shows a large pleural effusion on the right ? ? ?PROCEDURES: ? ?Critical Care performed: No ? ?Procedures ? ?The patient is on the cardiac monitor to evaluate for evidence of arrhythmia and/or significant heart rate changes. ? ? ?MEDICATIONS ORDERED IN ED: ?Medications  ?furosemide (LASIX) injection 80 mg (80 mg Intravenous Given 04/06/22 1711)  ? ? ? ?IMPRESSION / MDM / ASSESSMENT AND PLAN / ED COURSE  ?I reviewed the triage vital signs and the nursing notes. ?             ?               ? ?Differential diagnosis includes, but is not limited to, exudative effusion from CHF/end-stage renal disease, malignancy, less likely pneumonia ? ?The patient is a 88 year old male with prior history of right-sided pleural effusion requiring outpatient thoracentesis presents today with worsening right-sided pleural effusion and dyspnea.  He is satting okay on room air however he is tachypneic with decreased breath sounds on the right.  I reviewed the x-ray done from today as an outpatient which shows near opacification of the right hemithorax.  Labs overall reassuring.  No hyperkalemia, BNP however is quite elevated in the thousands.  Reviewed his EKG which shows A-fib with a right bundle no acute ischemic changes.  Given his degree of symptoms I think he would benefit from another thoracentesis.  I think this would best be served to be performed by interventional radiology and given his 5 PM cannot arrange this as an outpatient.  Did give him a dose of Lasix.  Will admit to the hospitalist service.  Cussed with hospitalist. ? ?  ? ? ?FINAL CLINICAL IMPRESSION(S) / ED DIAGNOSES  ? ?Final diagnoses:  ?Pleural effusion  ? ? ? ?Rx / DC Orders  ? ?ED Discharge  Orders   ? ? None  ? ?  ? ? ? ?Note:  This document was prepared using Dragon voice recognition software and may include unintentional dictation errors. ?  ?Rada Hay, MD ?04/06/22 1720 ? ?

## 2022-04-07 ENCOUNTER — Inpatient Hospital Stay: Payer: Medicare Other | Admitting: Radiology

## 2022-04-07 ENCOUNTER — Inpatient Hospital Stay: Payer: Medicare Other

## 2022-04-07 DIAGNOSIS — I482 Chronic atrial fibrillation, unspecified: Secondary | ICD-10-CM

## 2022-04-07 DIAGNOSIS — I4891 Unspecified atrial fibrillation: Secondary | ICD-10-CM

## 2022-04-07 DIAGNOSIS — E43 Unspecified severe protein-calorie malnutrition: Secondary | ICD-10-CM | POA: Insufficient documentation

## 2022-04-07 DIAGNOSIS — N186 End stage renal disease: Secondary | ICD-10-CM

## 2022-04-07 DIAGNOSIS — E1122 Type 2 diabetes mellitus with diabetic chronic kidney disease: Secondary | ICD-10-CM

## 2022-04-07 DIAGNOSIS — I4892 Unspecified atrial flutter: Secondary | ICD-10-CM

## 2022-04-07 DIAGNOSIS — I10 Essential (primary) hypertension: Secondary | ICD-10-CM

## 2022-04-07 DIAGNOSIS — J90 Pleural effusion, not elsewhere classified: Secondary | ICD-10-CM | POA: Diagnosis not present

## 2022-04-07 HISTORY — PX: IR THORACENTESIS ASP PLEURAL SPACE W/IMG GUIDE: IMG5380

## 2022-04-07 LAB — COMPREHENSIVE METABOLIC PANEL
ALT: 14 U/L (ref 0–44)
AST: 17 U/L (ref 15–41)
Albumin: 2.9 g/dL — ABNORMAL LOW (ref 3.5–5.0)
Alkaline Phosphatase: 92 U/L (ref 38–126)
Anion gap: 11 (ref 5–15)
BUN: 54 mg/dL — ABNORMAL HIGH (ref 8–23)
CO2: 26 mmol/L (ref 22–32)
Calcium: 8 mg/dL — ABNORMAL LOW (ref 8.9–10.3)
Chloride: 99 mmol/L (ref 98–111)
Creatinine, Ser: 4.18 mg/dL — ABNORMAL HIGH (ref 0.61–1.24)
GFR, Estimated: 13 mL/min — ABNORMAL LOW (ref >60)
Glucose, Bld: 90 mg/dL (ref 70–99)
Potassium: 4.1 mmol/L (ref 3.5–5.1)
Sodium: 136 mmol/L (ref 135–145)
Total Bilirubin: 1.1 mg/dL (ref 0.3–1.2)
Total Protein: 6.9 g/dL (ref 6.5–8.1)

## 2022-04-07 LAB — LACTATE DEHYDROGENASE, PLEURAL OR PERITONEAL FLUID: LD, Fluid: 103 U/L — ABNORMAL HIGH (ref 3–23)

## 2022-04-07 LAB — GLUCOSE, PLEURAL OR PERITONEAL FLUID: Glucose, Fluid: 96 mg/dL

## 2022-04-07 LAB — BODY FLUID CELL COUNT WITH DIFFERENTIAL
Eos, Fluid: 0 %
Lymphs, Fluid: 89 %
Monocyte-Macrophage-Serous Fluid: 9 %
Neutrophil Count, Fluid: 2 %
Total Nucleated Cell Count, Fluid: 1986 cu mm

## 2022-04-07 LAB — GLUCOSE, CAPILLARY: Glucose-Capillary: 89 mg/dL (ref 70–99)

## 2022-04-07 LAB — HEPATITIS B SURFACE ANTIBODY,QUALITATIVE: Hep B S Ab: NONREACTIVE

## 2022-04-07 LAB — HEPATITIS B SURFACE ANTIGEN: Hepatitis B Surface Ag: NONREACTIVE

## 2022-04-07 LAB — PROTEIN, PLEURAL OR PERITONEAL FLUID: Total protein, fluid: 3.5 g/dL

## 2022-04-07 LAB — LACTATE DEHYDROGENASE: LDH: 92 U/L — ABNORMAL LOW (ref 98–192)

## 2022-04-07 MED ORDER — LIDOCAINE HCL 1 % IJ SOLN
INTRAMUSCULAR | Status: AC
  Filled 2022-04-07: qty 20

## 2022-04-07 MED ORDER — ENSURE ENLIVE PO LIQD: 237.0000 mL | Freq: Two times a day (BID) | ORAL | 0 refills | Status: AC

## 2022-04-07 MED ORDER — TAMSULOSIN HCL 0.4 MG PO CAPS: 0.4000 mg | ORAL_CAPSULE | Freq: Every day | ORAL | Status: DC

## 2022-04-07 MED ORDER — CARVEDILOL 6.25 MG PO TABS
6.2500 mg | ORAL_TABLET | Freq: Two times a day (BID) | ORAL | Status: DC
  Administered 2022-04-07: 6.25 mg via ORAL
  Filled 2022-04-07: qty 1

## 2022-04-07 MED ORDER — SPIRONOLACTONE 25 MG PO TABS
25.0000 mg | ORAL_TABLET | Freq: Every day | ORAL | Status: DC
  Administered 2022-04-07: 25 mg via ORAL
  Filled 2022-04-07: qty 1

## 2022-04-07 MED ORDER — RENA-VITE PO TABS: 1.0000 | ORAL_TABLET | Freq: Every day | ORAL | Status: DC

## 2022-04-07 MED ORDER — CHLORHEXIDINE GLUCONATE CLOTH 2 % EX PADS: 6.0000 | MEDICATED_PAD | Freq: Every day | CUTANEOUS | Status: DC

## 2022-04-07 MED ORDER — TAMSULOSIN HCL 0.4 MG PO CAPS
0.4000 mg | ORAL_CAPSULE | Freq: Every day | ORAL | 0 refills | Status: AC
Start: 2022-04-08 — End: ?

## 2022-04-07 MED ORDER — SPIRONOLACTONE 25 MG PO TABS: 25.0000 mg | ORAL_TABLET | Freq: Every day | ORAL | 0 refills | Status: AC

## 2022-04-07 MED ORDER — ENSURE ENLIVE PO LIQD
237.0000 mL | Freq: Two times a day (BID) | ORAL | Status: DC
  Administered 2022-04-07: 237 mL via ORAL

## 2022-04-07 NOTE — Discharge Summary (Signed)
?Physician Discharge Summary ?  ?Patient: Wesley Henderson MRN: [id number] DOB: 12/17/1930  ?Admit date:     04/06/2022  ?Discharge date: 04/07/22  ?Discharge Physician: [name]  ? ?PCP: [name], MD  ? ?Recommendations at discharge:  ? ?Follow-up PCP 5 days ?Follow-up with pulmonary Dr. Lanney Gins 1 week ?Continue outpatient dialysis tomorrow. ? ?Discharge Diagnoses: ?Principal Problem: ?  Recurrent pleural effusion on right ?Active Problems: ?  End stage renal disease ([place]) ?  Protein-calorie malnutrition, severe ?  Diabetes mellitus with ESRD (end-stage renal disease) ([place]) ?  Chronic kidney disease ?  Coronary artery disease ?  Hypertension ?  Atrial fibrillation and flutter ([place]) ?  Atrial fibrillation, chronic ([place]) ? ? ? ?Hospital Course: ?Patient was admitted to the hospital on 04/06/2022 and discharged on 04/07/2022.  The patient has a recurrent pleural effusion.  Interventional radiology did a thoracentesis on 04/07/2022 which drew off 1550 mL.  Patient was seen by Dr. Lanney Gins pulmonary and he will follow-up closely in 1 week.  Cytology, flow cytometry, leukemia and lymphoma profile sent off but pending at this time (depending on these results may end up needing oncology referral).  No organisms seen on culture and Gram stain so far.  Repeat chest x-ray does show persistent pleural effusion but improved from thoracentesis.  Close follow-up as outpatient will be needed to avoid hospitalizations.  Pulmonary or internal medicine can write a prescription for thoracentesis as outpatient if fluid recurs. ? ?Assessment and Plan: ?* Recurrent pleural effusion on right ?Interventional radiology remove 1550 mL of fluid today.  Patient feeling better.  Seen by pulmonary and stable for discharge home.  Follow-up cytology, flow cytometry and leukemia lymphoma profile.  Pulmonary added Flomax and spironolactone to the patient's torsemide to continue diuresis. ? ?End stage renal disease ([place]) ?Continue  dialysis as outpatient. ? ?Protein-calorie malnutrition, severe ?Continue supplements ? ?Diabetes mellitus with ESRD (end-stage renal disease) ([place]) ?Last hemoglobin A1c 7.3 on 03/03/2022.  Continue diet control. ? ?Atrial fibrillation, chronic ([place]) ?Continue Coreg for rate control.  Patient only on aspirin for anticoagulation. ? ?Hypertension ?Continue Coreg.  Pulmonary added spironolactone and Flomax.  Continue to watch blood pressure as outpatient. ? ? ? ? ?  ? ? ?Consultants: Nephrology, pulmonary ?Procedures performed: Thoracentesis ?Disposition: Home ?Diet recommendation:  ?Cardiac diet ?DISCHARGE MEDICATION: ?Allergies as of 04/07/2022   ? ?   Reactions  ? Hydralazine Swelling  ? Swelling hands feet and ankles  ? ?  ? ?  ?Medication List  ?  ? ?TAKE these medications   ? ?albuterol (2.5 MG/3ML) 0.083% nebulizer solution ?Commonly known as: PROVENTIL ?Take 2.5 mg by nebulization every 6 (six) hours as needed for wheezing or shortness of breath. ?  ?albuterol 108 (90 Base) MCG/ACT inhaler ?Commonly known as: VENTOLIN HFA ?Inhale 2 puffs into the lungs every 6 (six) hours as needed for wheezing or shortness of breath. ?  ?aspirin EC 81 MG tablet ?Take 81 mg by mouth daily. ?  ?b complex-vitamin c-folic acid 0.8 MG Tabs tablet ?Take 1 tablet by mouth daily. ?  ?carvedilol 6.25 MG tablet ?Commonly known as: COREG ?Take 6.25 mg by mouth 2 (two) times daily. (On non-dialysis days) ?  ?feeding supplement Liqd ?Take 237 mLs by mouth 2 (two) times daily between meals. ?Start taking on: April 08, 2022 ?  ?simvastatin 20 MG tablet ?Commonly known as: ZOCOR ?Take 20 mg by mouth at bedtime. ?  ?spironolactone 25 MG tablet ?Commonly known as: ALDACTONE ?Take 1 tablet (  25 mg total) by mouth daily. ?  ?tamsulosin 0.4 MG Caps capsule ?Commonly known as: FLOMAX ?Take 1 capsule (0.4 mg total) by mouth daily after breakfast. ?Start taking on: April 08, 2022 ?  ?torsemide 20 MG tablet ?Commonly known as: DEMADEX ?Take 40 mg by  mouth daily. ?  ? ?  ? ? Follow-up Information   ? ? [name], MD Follow up in 5 day(s).   ?Specialty: Internal Medicine ?Contact information: ?[address] ?[place] [address] ?[phone] ? ? ?  ?  ? ? Ottie Glazier, MD Follow up in 1 week(s).   ?Specialty: Pulmonary Disease ?Contact information: ?[address] ?[place] [address] ?[phone] ? ? ?  ?  ? ?  ?  ? ?  ? ?Discharge Exam: ?Danley Danker Weights  ? 04/06/22 1517  ?Weight: 78 kg  ? ?Physical Exam ?HENT:  ?   Head: Normocephalic.  ?   Mouth/Throat:  ?   Pharynx: No oropharyngeal exudate.  ?Eyes:  ?   General: Lids are normal.  ?   Conjunctiva/sclera: Conjunctivae normal.  ?Cardiovascular:  ?   Rate and Rhythm: Normal rate. Rhythm irregularly irregular.  ?   Heart sounds: Normal heart sounds, S1 normal and S2 normal.  ?Pulmonary:  ?   Breath sounds: Examination of the right-middle field reveals decreased breath sounds. Examination of the right-lower field reveals decreased breath sounds and rhonchi. Examination of the left-lower field reveals decreased breath sounds. Decreased breath sounds and rhonchi present. No rales.  ?Abdominal:  ?   Palpations: Abdomen is soft.  ?   Tenderness: There is no abdominal tenderness.  ?Musculoskeletal:  ?   Right lower leg: No swelling.  ?   Left lower leg: No swelling.  ?Skin: ?   General: Skin is warm.  ?   Findings: No rash.  ?Neurological:  ?   Mental Status: He is alert and oriented to person, place, and time.  ?  ? ?Condition at discharge: stable ? ?The results of significant diagnostics from this hospitalization (including imaging, microbiology, ancillary and laboratory) are listed below for reference.  ? ?Imaging Studies: ?DG Chest 2 View ? ?Result Date: 04/06/2022 ?CLINICAL DATA:  Acute pulmonary edema (HCC) EXAM: CHEST - 2 VIEW COMPARISON:  03/13/2022 FINDINGS: Cardiomediastinal silhouette and pulmonary vasculature are within normal limits. Postsurgical changes of CABG again seen.  Interval worsening of right pleural effusion with near complete opacification of the right hemithorax. Left lung remains well aerated. Multiple loose bodies again seen in the left subcoracoid recess. IMPRESSION: Interval worsening of right pleural effusion with near complete opacification of the right hemithorax. Electronically Signed   By: Miachel Roux M.D.   On: 04/06/2022 12:45  ? ?DG Chest Port 1 View ? ?Result Date: 04/07/2022 ?CLINICAL DATA:  Status post right-sided thoracentesis. EXAM: PORTABLE CHEST 1 VIEW COMPARISON:  04/06/2022 FINDINGS: Smaller but persistent large right pleural effusion with compressive atelectasis in the residual aerated right lung. No postprocedural pneumothorax. The left lung remains clear. IMPRESSION: 1. Smaller but persistent large right pleural effusion. 2. No postprocedural pneumothorax. Electronically Signed   By: Marijo Sanes M.D.   On: 04/07/2022 10:30  ? ?DG Chest Port 1 View ? ?Result Date: 03/13/2022 ?CLINICAL DATA:  Status post right thoracentesis. EXAM: PORTABLE CHEST 1 VIEW COMPARISON:  March 02, 2022. FINDINGS: Right pleural effusion is smaller status post thoracentesis. No definite pneumothorax is noted. IMPRESSION: Right pleural effusion is smaller status post thoracentesis. Electronically Signed   By: Jeneen Rinks  Murlean Caller M.D.   On: 03/13/2022 14:18  ? ?IR THORACENTESIS ASP PLEURAL SPACE W/IMG GUIDE ? ?Result Date: 04/07/2022 ?INDICATION: Patient with congestive heart failure and end-stage renal disease with shortness of breath and recurrent right pleural effusion. EXAM: ULTRASOUND GUIDED RIGHT THORACENTESIS MEDICATIONS: Local 1% lidocaine only. COMPLICATIONS: None immediate. PROCEDURE: An ultrasound guided thoracentesis was thoroughly discussed with the patient and questions answered. The benefits, risks, alternatives and complications were also discussed. The patient understands and wishes to proceed with the procedure. Written consent was obtained. Ultrasound was  performed to localize and mark an adequate pocket of fluid in the right chest. The area was then prepped and draped in the normal sterile fashion. 1% Lidocaine was used for local anesthesia. Under ultrasound guidance a 19 gau

## 2022-04-07 NOTE — TOC Progression Note (Addendum)
Transition of Care (TOC) - Progression Note  ? ? ?Patient Details  ?Name: Wesley Henderson ?MRN: [id number] ?Date of Birth: [birth year] ? ?Transition of Care (TOC) CM/SW Contact  ?Pete Pelt, RN ?Phone Number: ?04/07/2022, 3:30 PM ? ?Clinical Narrative:   Patient lives with wife, who assists him.  He verbalizes no home concerns.  Notified of Code 44.  Discharge today, dialysis as outpatient ? ? ? ?Expected Discharge Plan: Home/Self Care ?Barriers to Discharge: Barriers Resolved ? ?Expected Discharge Plan and Services ?Expected Discharge Plan: Home/Self Care ?  ?Discharge Planning Services: CM Consult ?  ?Living arrangements for the past 2 months: [place] ?Expected Discharge Date: 04/07/22               ?  ?  ?  ?  ?  ?  ?  ?  ?  ?  ? ? ?Social Determinants of Health (SDOH) Interventions ?  ? ?Readmission Risk Interventions ? ?  04/07/2022  ?  3:26 PM  ?Readmission Risk Prevention Plan  ?Transportation Screening Complete  ?PCP or Specialist Appt within 5-7 Days Complete  ?Home Care Screening Complete  ?Medication Review (RN CM) Complete  ? ? ?

## 2022-04-07 NOTE — Consult Note (Signed)
? ? ? ?PULMONOLOGY ? ? ? ? ? ? ? ? ?Date: 04/07/2022,   ?MRN# [id number] Wesley Henderson 11/11/1930 ? ? ?  ?AdmissionWeight: 78 kg                 ?CurrentWeight: 78 kg ? ?Referring provider: Dr Leslye Peer ? ? ?CHIEF COMPLAINT:  ? ?Dyspnea with pleural effusion ? ? ?HISTORY OF PRESENT ILLNESS  ? ? This is a 88 yo M with hx of CAD on doac, CABG, recurrent effusions, AF, ESRD on HD thrice weekly followed by [facility], CHF came for worsening DOE x 6 months with most recent worsening x 1 wk with cough. He is accompanied by wife during interview. He was found to have R pleural effusion with compressive atelectasis on CXR on admission . He underwent thoracentesis with removal of fluid and residual effusion on right but improved CXR post procedure with no pneumothorax. PCCM consultation for additional evaluation and management.  ? ? ?PAST MEDICAL HISTORY  ? ?Past Medical History:  ?Diagnosis Date  ? Anemia of chronic renal disease   ? Anxiety   ? CAD (coronary artery disease)   ? CHF (congestive heart failure) ([place])   ? Diabetic glomerulopathy ([place])   ? ESRD (end stage renal disease) on dialysis [facility])   ? HD on MWF  ? 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 3 vessel  ? Hyperlipidemia   ? Hypertension   ? Secondary hyperparathyroidism of renal origin [facility])   ? Type II diabetes mellitus with nephropathy ([place])   ? ? ? ?SURGICAL HISTORY  ? ?Past Surgical History:  ?Procedure Laterality Date  ? AV FISTULA PLACEMENT Left 08/06/2021  ? Procedure: ARTERIOVENOUS (AV) FISTULA CREATION (BRACHIALCEPHALIC);  Surgeon: Katha Cabal, MD;  Location: [facility] ORS;  Service: Vascular;  Laterality: Left;  ? CAPD INSERTION N/A 03/04/2018  ? Procedure: LAPAROSCOPIC INSERTION CONTINUOUS AMBULATORY PERITONEAL DIALYSIS  (CAPD) CATHETER;  Surgeon: Katha Cabal, MD;  Location: [facility] ORS;  Service: Vascular;  Laterality: N/A;  ? CAPD REVISION N/A 04/06/2018  ? Procedure: LAPAROSCOPIC REVISION CONTINUOUS AMBULATORY PERITONEAL DIALYSIS  (CAPD) CATHETER;  Surgeon: Katha Cabal, MD;  Location: [facility] ORS;  Service: Vascular;  Laterality: N/A;  ? CAPD REVISION  04/14/2021  ? CORONARY ARTERY BYPASS GRAFT  09/1995  ? 3 vessels  ? DIALYSIS/PERMA CATHETER INSERTION N/A 02/07/2021  ? Procedure: DIALYSIS/PERMA CATHETER INSERTION;  Surgeon: Katha Cabal, MD;  Location: [facility];  Service: Cardiovascular;  Laterality: N/A;  ? DIALYSIS/PERMA CATHETER REMOVAL N/A 12/04/2021  ? Procedure: DIALYSIS/PERMA CATHETER REMOVAL;  Surgeon: Algernon Huxley, MD;  Location: [place];  Service: Cardiovascular;  Laterality: N/A;  ? PERITONEAL CATHETER REMOVAL  05/21/2021  ? PERITONEAL CATHETER REMOVAL  05/20/2018  ? ? ? ?FAMILY HISTORY  ? ?Family History  ?Problem Relation Age of Onset  ? Other Mother   ? Hypertension Mother   ? ? ? ?SOCIAL HISTORY  ? ?Social History  ? ?Tobacco Use  ? Smoking status: Former  ?  Types: Cigars  ? Smokeless tobacco: Never  ?Vaping Use  ? Vaping Use: Never used  ?Substance Use Topics  ? Alcohol use: No  ? Drug use: No  ? ? ? ?MEDICATIONS  ? ? ?Home Medication:  ?  ?Current Medication: ? ?Current Facility-Administered Medications:  ?  aspirin EC tablet 81 mg, 81 mg, Oral, Daily, Wouk, Ailene Rud, MD, 81 mg at 04/07/22 0836 ?  carvedilol (COREG) tablet 6.25 mg, 6.25 mg, Oral,  BID WC, [name], MD, 6.25 mg at 04/07/22 0931 ?  [START ON 04/08/2022] Chlorhexidine Gluconate Cloth 2 % PADS 6 each, 6 each, Topical, Q0600, Breeze, Shantelle, NP ?  feeding supplement (ENSURE ENLIVE / ENSURE PLUS) liquid 237 mL, 237 mL, Oral, BID BM, Leslye Peer, Richard, MD, 237 mL at 04/07/22 1344 ?  lidocaine (XYLOCAINE) 1 % (with pres) injection, , , ,  ?  melatonin tablet 5 mg, 5 mg, Oral, QHS PRN, Foust, Katy L, NP, 5 mg at 04/06/22 2207 ?  multivitamin (RENA-VIT) tablet 1 tablet, 1 tablet, Oral, QHS, Wieting, Richard, MD ?  simvastatin (ZOCOR) tablet 20 mg, 20 mg, Oral, QHS, Wouk, Ailene Rud, MD, 20 mg at 04/06/22 2207 ?  torsemide (DEMADEX) tablet 40 mg, 40 mg,  Oral, Daily, Wouk, Ailene Rud, MD, 40 mg at 04/07/22 2595 ? ? ? ?ALLERGIES  ? ?Hydralazine ? ? ? ? ?REVIEW OF SYSTEMS  ? ? ?Review of Systems: ? ?Gen:  Denies  fever, sweats, chills weigh loss  ?HEENT: Denies blurred vision, double vision, ear pain, eye pain, hearing loss, nose bleeds, sore throat ?Cardiac:  No dizziness, chest pain or heaviness, chest tightness,edema ?Resp:   reports dyspnea chronically  ?Gi: Denies swallowing difficulty, stomach pain, nausea or vomiting, diarrhea, constipation, bowel incontinence ?Gu:  Denies bladder incontinence, burning urine ?Ext:   Denies Joint pain, stiffness or swelling ?Skin: Denies  skin rash, easy bruising or bleeding or hives ?Endoc:  Denies polyuria, polydipsia , polyphagia or weight change ?Psych:   Denies depression, insomnia or hallucinations  ? ?Other:  All other systems negative ? ? ?VS: BP 139/71 (BP Location: Right Arm)   Pulse 66   Temp (!) 97.5 ?F (36.4 ?C) (Oral)   Resp 17   Ht 5\' 8"  (1.727 m)   Wt 78 kg   SpO2 97%   BMI 26.15 kg/m?   ? ? ? ?PHYSICAL EXAM  ? ? ?GENERAL:NAD, no fevers, chills, no weakness no fatigue ?HEAD: Normocephalic, atraumatic.  ?EYES: Pupils equal, round, reactive to light. Extraocular muscles intact. No scleral icterus.  ?MOUTH: Moist mucosal membrane. Dentition intact. No abscess noted.  ?EAR, NOSE, THROAT: Clear without exudates. No external lesions.  ?NECK: Supple. No thyromegaly. No nodules. No JVD.  ?PULMONARY: decreased breath sounds with mild rhonchi worse at bases bilaterally.  ?CARDIOVASCULAR: S1 and S2. Regular rate and rhythm. No murmurs, rubs, or gallops. No edema. Pedal pulses 2+ bilaterally.  ?GASTROINTESTINAL: Soft, nontender, nondistended. No masses. Positive bowel sounds. No hepatosplenomegaly.  ?MUSCULOSKELETAL: No swelling, clubbing, or edema. Range of motion full in all extremities.  ?NEUROLOGIC: Cranial nerves II through XII are intact. No gross focal neurological deficits. Sensation intact. Reflexes intact.   ?SKIN: No ulceration, lesions, rashes, or cyanosis. Skin warm and dry. Turgor intact.  ?PSYCHIATRIC: Mood, affect within normal limits. The patient is awake, alert and oriented x 3. Insight, judgment intact.  ? ? ?  ? ?IMAGING  ? ? ? ?ASSESSMENT/PLAN  ? ?Complete right atelectasis  ?   - patient with recurrent right pleural effusion  ?   - he has MWD dialysis ?    - he has improved and is on room air ?    - he can come to clinic for additional CXR  ?    - he has pleural fluid studies pending ?   - would recommend inrcreased diuresis with addition of flomax and spiranolactone ?   -will send home with incentive spirometry ?   ? ? ?  ? ?  Thank you for allowing me to participate in the care of this patient.  ? ?Patient/Family are satisfied with care plan and all questions have been answered.  ? ? ?Provider disclosure: ?Patient with at least one acute or chronic illness or injury that poses a threat to life or bodily function and is being managed actively during this encounter.  All of the below services have been performed independently by signing provider:  review of prior documentation from internal and or external health records.  Review of previous and current lab results.  Interview and comprehensive assessment during patient visit today. Review of current and previous chest radiographs/CT scans. Discussion of management and test interpretation with health care team and patient/family.  ? ?This document was prepared using Dragon voice recognition software and may include unintentional dictation errors. ? ? ?  ?Ottie Glazier, M.D.  ?Division of Pulmonary & Critical Care Medicine  ? ? ? ? ? ? ? ? ?

## 2022-04-07 NOTE — Assessment & Plan Note (Addendum)
Interventional radiology remove 1550 mL of fluid today.  Patient feeling better.  Seen by pulmonary and stable for discharge home.  Follow-up cytology, flow cytometry and leukemia lymphoma profile.  Pulmonary added Flomax and spironolactone to the patient's torsemide to continue diuresis. ?

## 2022-04-07 NOTE — Plan of Care (Signed)
0500 FBS = 89 ?

## 2022-04-07 NOTE — Progress Notes (Signed)
Escorted to car via wheelchair-no distress noted ?

## 2022-04-07 NOTE — Procedures (Signed)
PROCEDURE SUMMARY: ? ?Successful US guided right thoracentesis. ?Yielded 1550 mL of amber colored fluid. ?Pt tolerated procedure well. ?No immediate complications. ? ?Specimen was sent for labs. ?CXR ordered. ? ?EBL < 1 mL ? ?Tsosie Billing D PA-C ?04/07/2022 ?12:00 PM ? ? ? ?

## 2022-04-07 NOTE — Progress Notes (Signed)
Initial Nutrition Assessment ? ?DOCUMENTATION CODES:  ? ?Severe malnutrition in context of chronic illness ? ?INTERVENTION:  ?- Liberalize diet from a renal/carb modified to a 2g sodium to provide widest variety of menu options to enhance nutritional adequacy ? ?- Ensure Enlive po BID, each supplement provides 350 kcal and 20 grams of protein. ? ?- Renal MVI with minerals daily ? ?NUTRITION DIAGNOSIS:  ? ?Severe Malnutrition related to chronic illness (ESRD) as evidenced by energy intake < or equal to 75% for > or equal to 1 month, moderate fat depletion, severe muscle depletion, moderate muscle depletion. ? ?GOAL:  ? ?Patient will meet greater than or equal to 90% of their needs ? ?MONITOR:  ? ?PO intake, Supplement acceptance, Diet advancement, Labs, Weight trends ? ?REASON FOR ASSESSMENT:  ? ?Malnutrition Screening Tool ?  ? ?ASSESSMENT:  ? ?Pt admitted with dyspnea secondary to pleural effusion. PMH significant for recurrent pleural effusion, ESRD on HD (MWF), T2DM, CAD, CHF and afib. ? ?04/18: s/p thoracentesis yielding 1573ml of fluid; CXR show improvement in plural effusion but large volume remains ? ?Spoke with pt at bedside. Wife present during visit. He states that he has been less mobile around the house and has had a poor appetite for the past year and states that he may eat 1-2 meals daily and intake does not vary depending on dialysis versus non-dialysis days. He typically drinks 1 Ensure every other day, during dialysis sessions and is agreeable to receive them during admission.  ? ?Given pt's appetite and PO intake has been poor for several months, he would benefit from a liberalized diet to provide widest variety of menu options to enhance nutritional adequacy.  ? ?Pt states that his EDW is 168 lbs and endorses a recent weight loss of ~20 lbs within this last year.  ? ?Reviewed weight history. Unable to confirm this weight loss. Suspect weights are stated weights and not true weight changes. Current  weight noted to be 172 lbs. Will continue to monitor weights throughout admission.  ? ?Medications reviewed ? ?Labs: sodium and potassium WNL, BUN 54, Cr 4.18,  ? corrected calcium 8.88, GFR 13, HgbA1c 6.0%, ?  CBG's 89-152 x24 hours ? ?NUTRITION - FOCUSED PHYSICAL EXAM: ? ?Flowsheet Row Most Recent Value  ?Orbital Region Moderate depletion  ?Upper Arm Region Moderate depletion  ?Thoracic and Lumbar Region Mild depletion  ?Buccal Region Moderate depletion  ?Temple Region Moderate depletion  ?Clavicle Bone Region Severe depletion  ?Clavicle and Acromion Bone Region Moderate depletion  ?Scapular Bone Region Moderate depletion  ?Dorsal Hand Mild depletion  ?Patellar Region Severe depletion  ?Anterior Thigh Region Severe depletion  ?Posterior Calf Region Severe depletion  ?Edema (RD Assessment) Mild  [non-pitting BLE]  ?Hair Reviewed  ?Eyes Reviewed  ?Mouth Reviewed  ?Skin Reviewed  ?Nails Reviewed  ? ?  ? ? ?Diet Order:   ?Diet Order   ? ?       ?  Diet 2 gram sodium Room service appropriate? Yes; Fluid consistency: Thin; Fluid restriction: 1200 mL Fluid  Diet effective now       ?  ? ?  ?  ? ?  ? ? ?EDUCATION NEEDS:  ? ?Education needs have been addressed ? ?Skin:  Skin Assessment: Reviewed RN Assessment ? ?Last BM:  4/16 (per pt) ? ?Height:  ? ?Ht Readings from Last 1 Encounters:  ?04/06/22 5\' 8"  (1.727 m)  ? ? ?Weight:  ? ?Wt Readings from Last 1 Encounters:  ?04/06/22 78 kg  ? ?  BMI:  Body mass index is 26.15 kg/m?. ? ?Estimated Nutritional Needs:  ? ?Kcal:  1950-2150 ? ?Protein:  100-115g ? ?Fluid:  1L + UOP ? ?Clayborne Dana, RDN, LDN ?Clinical Nutrition ?

## 2022-04-07 NOTE — Assessment & Plan Note (Signed)
Continue dialysis as outpatient. ?

## 2022-04-07 NOTE — Assessment & Plan Note (Signed)
Continue Coreg.  Pulmonary added spironolactone and Flomax.  Continue to watch blood pressure as outpatient. ?

## 2022-04-07 NOTE — Progress Notes (Signed)
?[facility]  ?ROUNDING NOTE  ? ?Subjective:  ? ?Wesley Henderson is a 88 y.o. male with past medical history of diabetes, CAD with CABG, recurrent pleural effusions, atrial fib, and ESRD on dialysis. Patient presents to ED with shortness of breath. He has been admitted for Pleural effusion [J90] ? ?Patient is known to our practice and receives outpatient treatments at [facility] on a MWF schedule, supervised by Dr Juleen China. His last treatment was Monday, received a full treatment. Patient seen sitting up on bed with wife at bedside. States patient has been unable to sleep for 2 nights due to shortness of breath. Reports cough without sputum. Denies nausea and vomiting. Reports he has maintained dietary and fluid restrictions. Denies chest and abdominal pain.  ? ?Labs stable this morning for renal patient. Negative respiratory panel. Thoracentesis performed this morning yielding 1550 ml from right side. Labs sent on fluid. Follow up chest xray shows improvement in pleural effusion, but large volume remains. ? ?We have been consulted to manage dialysis needs.  ? ? ?Objective:  ?Vital signs in last 24 hours:  ?Temp:  [97.5 ?F (36.4 ?C)-98.3 ?F (36.8 ?C)] 97.5 ?F (36.4 ?C) (04/18 0741) ?Pulse Rate:  [65-93] 66 (04/18 0928) ?Resp:  [17-41] 17 (04/18 0741) ?BP: (128-158)/(57-99) 139/71 (04/18 6301) ?SpO2:  [95 %-99 %] 97 % (04/18 0741) ?Weight:  [78 kg] 78 kg (04/17 1517) ? ?Weight change:  ?Filed Weights  ? 04/06/22 1517  ?Weight: 78 kg  ? ? ?Intake/Output: ?No intake/output data recorded. ?  ?Intake/Output this shift: ? No intake/output data recorded. ? ?Physical Exam: ?General: NAD, resting comfortably  ?Head: Normocephalic, atraumatic. Moist oral mucosal membranes  ?Eyes: Anicteric  ?Lungs:  Crackles Rt<Lt, Rhonchi, normal effort  ?Heart: Regular rate and rhythm  ?Abdomen:  Soft, nontender  ?Extremities:  trace peripheral edema.  ?Neurologic: Nonfocal, moving all four extremities  ?Skin: No  lesions  ?Access: Lt AVF  ? ? ?Basic Metabolic Panel: ?Recent Labs  ?Lab 04/06/22 ?1523 04/07/22 ?0543  ?NA 134* 136  ?K 4.2 4.1  ?CL 97* 99  ?CO2 27 26  ?GLUCOSE 174* 90  ?BUN 42* 54*  ?CREATININE 3.63* 4.18*  ?CALCIUM 7.9* 8.0*  ? ? ?Liver Function Tests: ?Recent Labs  ?Lab 04/06/22 ?1523 04/07/22 ?0543  ?AST 19 17  ?ALT 17 14  ?ALKPHOS 114 92  ?BILITOT 0.7 1.1  ?PROT 7.3 6.9  ?ALBUMIN 3.1* 2.9*  ? ?No results for input(s): LIPASE, AMYLASE in the last 168 hours. ?No results for input(s): AMMONIA in the last 168 hours. ? ?CBC: ?Recent Labs  ?Lab 04/06/22 ?1523  ?WBC 8.9  ?HGB 11.7*  ?HCT 36.2*  ?MCV 100.0  ?PLT 270  ? ? ?Cardiac Enzymes: ?No results for input(s): CKTOTAL, CKMB, CKMBINDEX, TROPONINI in the last 168 hours. ? ?BNP: ?Invalid input(s): POCBNP ? ?CBG: ?Recent Labs  ?Lab 04/07/22 ?0512  ?GLUCAP 89  ? ? ?Microbiology: ?Results for orders placed or performed during the hospital encounter of 04/06/22  ?Resp Panel by RT-PCR (Flu A&B, Covid) Nasopharyngeal Swab     Status: None  ? Collection Time: 04/06/22  8:58 PM  ? Specimen: Nasopharyngeal Swab; Nasopharyngeal(NP) swabs in vial transport medium  ?Result Value Ref Range Status  ? SARS Coronavirus 2 by RT PCR NEGATIVE NEGATIVE Final  ?  Comment: (NOTE) ?SARS-CoV-2 target nucleic acids are NOT DETECTED. ? ?The SARS-CoV-2 RNA is generally detectable in upper respiratory ?specimens during the acute phase of infection. The lowest ?concentration of SARS-CoV-2 viral copies  this assay can detect is ?138 copies/mL. A negative result does not preclude SARS-Cov-2 ?infection and should not be used as the sole basis for treatment or ?other patient management decisions. A negative result may occur with  ?improper specimen collection/handling, submission of specimen other ?than nasopharyngeal swab, presence of viral mutation(s) within the ?areas targeted by this assay, and inadequate number of viral ?copies(<138 copies/mL). A negative result must be combined with ?clinical  observations, patient history, and epidemiological ?information. The expected result is Negative. ? ?Fact Sheet for Patients:  ?EntrepreneurPulse.com.au ? ?Fact Sheet for Healthcare Providers:  ?IncredibleEmployment.be ? ?This test is no t yet approved or cleared by the [place] FDA and  ?has been authorized for detection and/or diagnosis of SARS-CoV-2 by ?FDA under an Emergency Use Authorization (EUA). This EUA will remain  ?in effect (meaning this test can be used) for the duration of the ?COVID-19 declaration under Section 564(b)(1) of the Act, 21 ?U.S.C.section 360bbb-3(b)(1), unless the authorization is terminated  ?or revoked sooner.  ? ? ?  ? Influenza A by PCR NEGATIVE NEGATIVE Final  ? Influenza B by PCR NEGATIVE NEGATIVE Final  ?  Comment: (NOTE) ?The Xpert Xpress SARS-CoV-2/FLU/RSV plus assay is intended as an aid ?in the diagnosis of influenza from Nasopharyngeal swab specimens and ?should not be used as a sole basis for treatment. Nasal washings and ?aspirates are unacceptable for Xpert Xpress SARS-CoV-2/FLU/RSV ?testing. ? ?Fact Sheet for Patients: ?EntrepreneurPulse.com.au ? ?Fact Sheet for Healthcare Providers: ?IncredibleEmployment.be ? ?This test is not yet approved or cleared by the [place] FDA and ?has been authorized for detection and/or diagnosis of SARS-CoV-2 by ?FDA under an Emergency Use Authorization (EUA). This EUA will remain ?in effect (meaning this test can be used) for the duration of the ?COVID-19 declaration under Section 564(b)(1) of the Act, 21 U.S.C. ?section 360bbb-3(b)(1), unless the authorization is terminated or ?revoked. ? ?Performed at [facility], [place], ?[place] [address] ?  ? ? ?Coagulation Studies: ?No results for input(s): LABPROT, INR in the last 72 hours. ? ?Urinalysis: ?No results for input(s): COLORURINE, LABSPEC, [place], GLUCOSEU, HGBUR, BILIRUBINUR,  KETONESUR, PROTEINUR, UROBILINOGEN, NITRITE, LEUKOCYTESUR in the last 72 hours. ? ?Invalid input(s): APPERANCEUR  ? ? ?Imaging: ?DG Chest 2 View ? ?Result Date: 04/06/2022 ?CLINICAL DATA:  Acute pulmonary edema (HCC) EXAM: CHEST - 2 VIEW COMPARISON:  03/13/2022 FINDINGS: Cardiomediastinal silhouette and pulmonary vasculature are within normal limits. Postsurgical changes of CABG again seen. Interval worsening of right pleural effusion with near complete opacification of the right hemithorax. Left lung remains well aerated. Multiple loose bodies again seen in the left subcoracoid recess. IMPRESSION: Interval worsening of right pleural effusion with near complete opacification of the right hemithorax. Electronically Signed   By: Miachel Roux M.D.   On: 04/06/2022 12:45  ? ?DG Chest Port 1 View ? ?Result Date: 04/07/2022 ?CLINICAL DATA:  Status post right-sided thoracentesis. EXAM: PORTABLE CHEST 1 VIEW COMPARISON:  04/06/2022 FINDINGS: Smaller but persistent large right pleural effusion with compressive atelectasis in the residual aerated right lung. No postprocedural pneumothorax. The left lung remains clear. IMPRESSION: 1. Smaller but persistent large right pleural effusion. 2. No postprocedural pneumothorax. Electronically Signed   By: Marijo Sanes M.D.   On: 04/07/2022 10:30   ? ? ?Medications:  ? ? ? aspirin EC  81 mg Oral Daily  ? carvedilol  6.25 mg Oral BID WC  ? lidocaine      ? simvastatin  20 mg Oral QHS  ?  torsemide  40 mg Oral Daily  ? ?melatonin ? ?Assessment/ Plan:  ?[name] is a 88 y.o.  male with past medical history of diabetes, CAD with CABG, recurrent pleural effusions, atrial fib, and ESRD on dialysis. Patient presents to ED with shortness of breath. He has been admitted for Pleural effusion [J90] ? ?[name]/MWF/Lt AVF/ 68.0 kg ? ?End-stage renal disease on hemodialysis.  Will maintain outpatient schedule if possible.  Patient received full treatment on Monday.  Next  treatment scheduled for Wednesday. ? ?2. Anemia of chronic kidney disease ? Normocytic ?Lab Results  ?Component Value Date  ? HGB 11.7 (L) 04/06/2022  ?  ?Hgb within acceptable range, will continue to monitor ?Mirc

## 2022-04-07 NOTE — Assessment & Plan Note (Signed)
Continue supplements

## 2022-04-07 NOTE — Assessment & Plan Note (Signed)
Continue Coreg for rate control.  Patient only on aspirin for anticoagulation. ?

## 2022-04-07 NOTE — Care Management CC44 (Signed)
Condition Code 44 Documentation Completed ? ?Patient Details  ?Name: Wesley Henderson ?MRN: [id number] ?Date of Birth: 06/24/1930 ? ? ?Condition Code 44 given:   yes ?Patient signature on Condition Code 44 notice:   patient unable to sign, notified by phone ?Documentation of 2 MD's agreement:   yes ?Code 44 added to claim:   yes. ? ? ? ?Pete Pelt, RN ?04/07/2022, 3:29 PM ? ?

## 2022-04-07 NOTE — Assessment & Plan Note (Addendum)
Last hemoglobin A1c 7.3 on 03/03/2022.  Continue diet control. ?

## 2022-04-08 LAB — HEPATITIS B SURFACE ANTIBODY, QUANTITATIVE: Hep B S AB Quant (Post): <3.1 m[IU]/mL — ABNORMAL LOW (ref >9.9)

## 2022-04-08 LAB — CYTOLOGY - NON PAP

## 2022-04-09 LAB — COMP PANEL: LEUKEMIA/LYMPHOMA

## 2022-04-09 LAB — CHOLESTEROL, BODY FLUID: Cholesterol, Fluid: 40 mg/dL

## 2022-04-10 LAB — BODY FLUID CULTURE W GRAM STAIN: Culture: NO GROWTH

## 2022-04-17 ENCOUNTER — Other Ambulatory Visit: Payer: Self-pay | Admitting: Pulmonary Disease

## 2022-04-17 ENCOUNTER — Ambulatory Visit
Admission: RE | Admit: 2022-04-17 | Discharge: 2022-04-17 | Disposition: A | Discharge status: Home / Self Care | Payer: Medicare Other | Source: Ambulatory Visit | Attending: Physician Assistant | Admitting: Physician Assistant

## 2022-04-17 ENCOUNTER — Other Ambulatory Visit: Payer: Self-pay | Admitting: Physician Assistant

## 2022-04-17 ENCOUNTER — Ambulatory Visit
Admission: RE | Admit: 2022-04-17 | Discharge: 2022-04-17 | Disposition: A | Discharge status: Home / Self Care | Payer: Medicare Other | Source: Ambulatory Visit | Attending: Pulmonary Disease | Admitting: Pulmonary Disease

## 2022-04-17 DIAGNOSIS — J90 Pleural effusion, not elsewhere classified: Secondary | ICD-10-CM

## 2022-04-17 DIAGNOSIS — Z992 Dependence on renal dialysis: Secondary | ICD-10-CM | POA: Insufficient documentation

## 2022-04-17 DIAGNOSIS — I509 Heart failure, unspecified: Secondary | ICD-10-CM | POA: Insufficient documentation

## 2022-04-17 DIAGNOSIS — Z9889 Other specified postprocedural states: Secondary | ICD-10-CM

## 2022-04-17 DIAGNOSIS — N186 End stage renal disease: Secondary | ICD-10-CM | POA: Diagnosis not present

## 2022-04-17 LAB — LACTATE DEHYDROGENASE, PLEURAL OR PERITONEAL FLUID: LD, Fluid: 135 U/L — ABNORMAL HIGH (ref 3–23)

## 2022-04-17 LAB — GLUCOSE, PLEURAL OR PERITONEAL FLUID: Glucose, Fluid: 116 mg/dL

## 2022-04-17 LAB — BODY FLUID CELL COUNT WITH DIFFERENTIAL
Eos, Fluid: 1 %
Lymphs, Fluid: 84 %
Monocyte-Macrophage-Serous Fluid: 11 %
Neutrophil Count, Fluid: 4 %
Total Nucleated Cell Count, Fluid: 679 cu mm

## 2022-04-17 NOTE — Procedures (Signed)
PROCEDURE SUMMARY: ? ?Successful US guided right thoracentesis. ?Yielded 1.4 L of red fluid. ?Patient tolerated procedure well. ?No immediate complications. ?EBL = trace ?Fluid now appears loculated on [place]. ? ?Post procedure chest X-ray reveals no pneumothorax ? ?Murrell Redden PA-C ?04/17/2022 ?12:11 PM ? ? ? ?

## 2022-04-20 LAB — CYTOLOGY - NON PAP

## 2022-04-21 ENCOUNTER — Telehealth: Payer: Self-pay | Admitting: Nurse Practitioner

## 2022-04-21 NOTE — Telephone Encounter (Signed)
Spoke with patient's wife Wesley Henderson regarding the Palliative referral & services and all questions were answered and she has declined Palliative services at this time.  Instructed wife to contact PCP if they change their minds and want Palliative services in the future and she was in agreement with this.   ?

## 2022-04-30 ENCOUNTER — Other Ambulatory Visit: Payer: Self-pay | Admitting: Pulmonary Disease

## 2022-04-30 ENCOUNTER — Ambulatory Visit
Admission: RE | Admit: 2022-04-30 | Discharge: 2022-04-30 | Disposition: A | Discharge status: Home / Self Care | Payer: Medicare Other | Source: Ambulatory Visit | Attending: Internal Medicine | Admitting: Internal Medicine

## 2022-04-30 ENCOUNTER — Other Ambulatory Visit: Payer: Self-pay | Admitting: Internal Medicine

## 2022-04-30 ENCOUNTER — Ambulatory Visit
Admission: RE | Admit: 2022-04-30 | Discharge: 2022-04-30 | Disposition: A | Discharge status: Home / Self Care | Payer: Medicare Other | Source: Ambulatory Visit | Attending: Pulmonary Disease | Admitting: Pulmonary Disease

## 2022-04-30 DIAGNOSIS — Z9889 Other specified postprocedural states: Secondary | ICD-10-CM | POA: Diagnosis not present

## 2022-04-30 DIAGNOSIS — J90 Pleural effusion, not elsewhere classified: Secondary | ICD-10-CM

## 2022-04-30 DIAGNOSIS — R0602 Shortness of breath: Secondary | ICD-10-CM | POA: Insufficient documentation

## 2022-04-30 DIAGNOSIS — J9811 Atelectasis: Secondary | ICD-10-CM | POA: Insufficient documentation

## 2022-04-30 NOTE — Procedures (Signed)
PROCEDURE SUMMARY: ? ?Successful US guided therapeutic right thoracentesis. ?Yielded 1 L of serosanguinous fluid. ?Pt tolerated procedure well. ?No immediate complications. ? ?Specimen not sent for labs. ?CXR ordered. ? ?EBL < 1 mL ? ?Tyson Alias, AGNP ?04/30/2022 ?2:30 PM ? ?   ?

## 2022-05-02 IMAGING — US US THORACENTESIS ASP PLEURAL SPACE W/IMG GUIDE
1 series · 4 of 4 positions shown · non-contrast
Comparison: none

INDICATION: Patient with a history of heart failure and end-stage renal disease
presents today with a right pleural effusion. Interventional
radiology asked to perform a therapeutic thoracentesis.

[Series 1: us thoracentesis asp pleural space w/img guide · 4 of 4 slices shown]
[im 1/4]
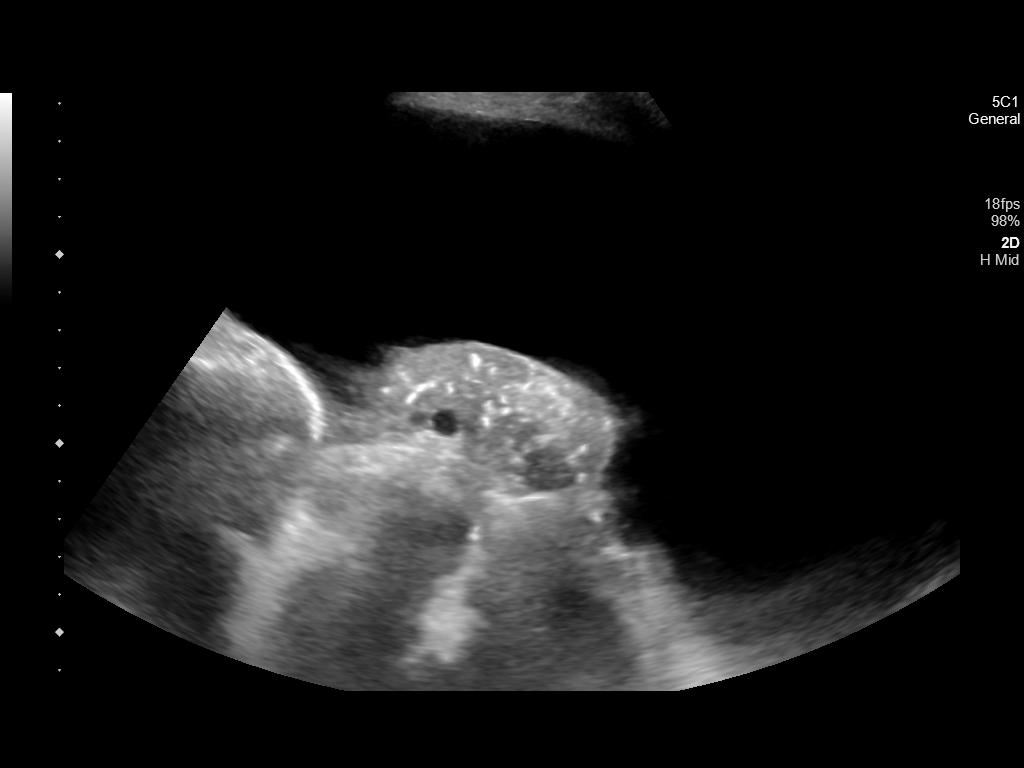
[im 2/4]
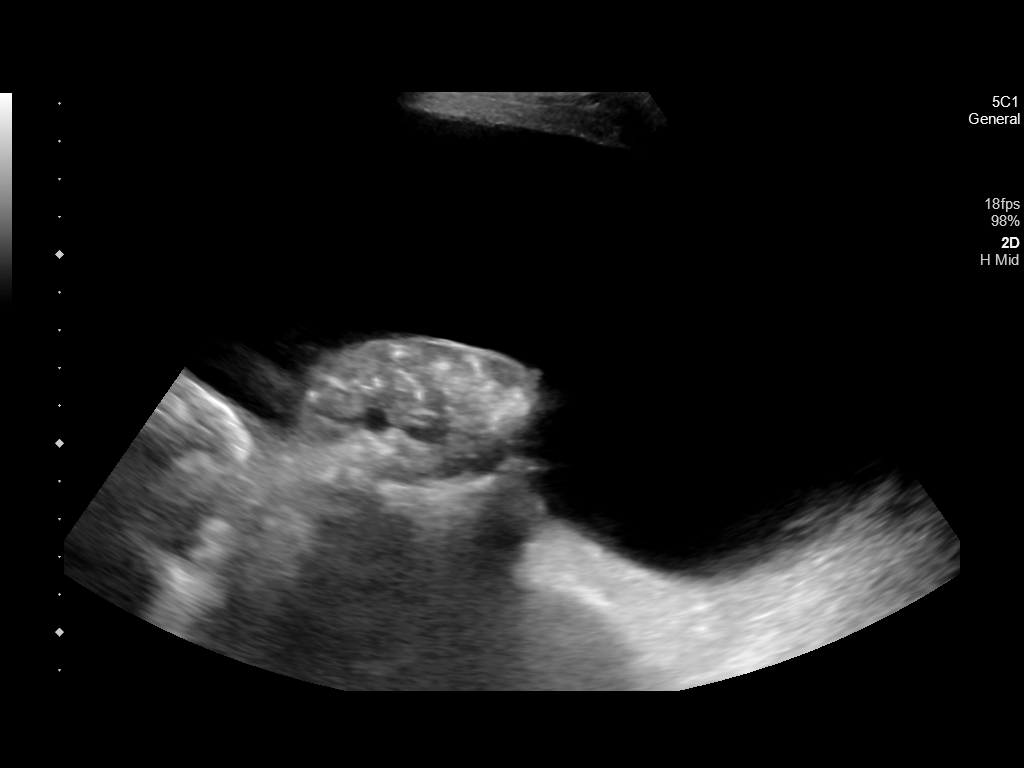
[im 3/4]
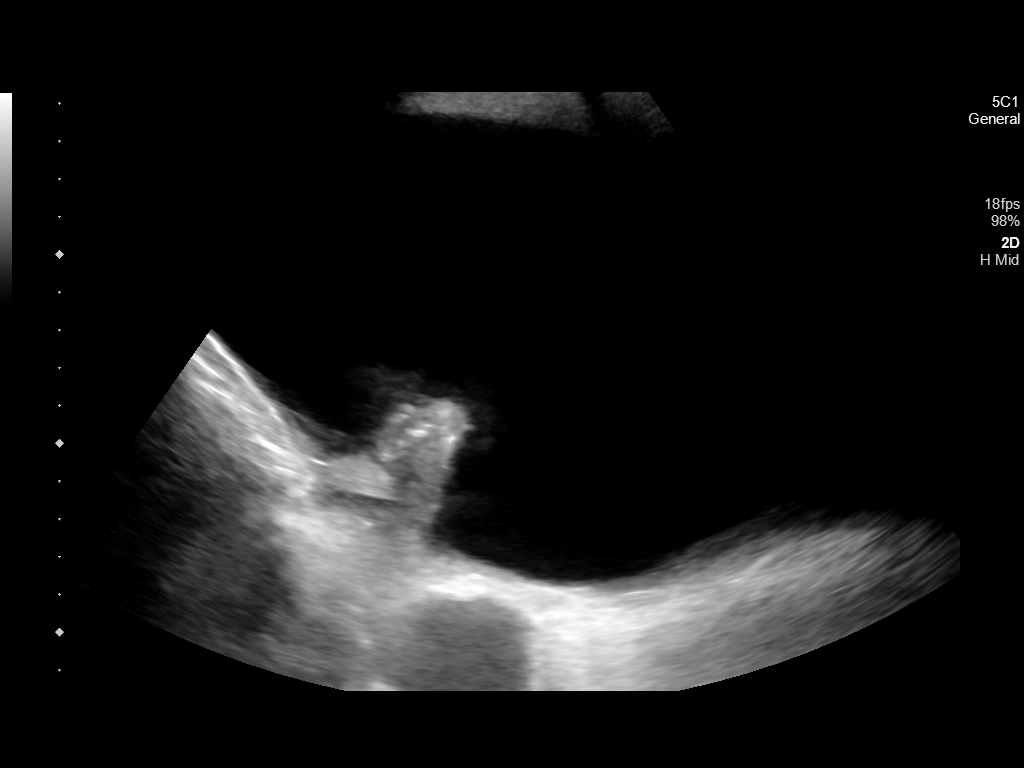
[im 4/4]
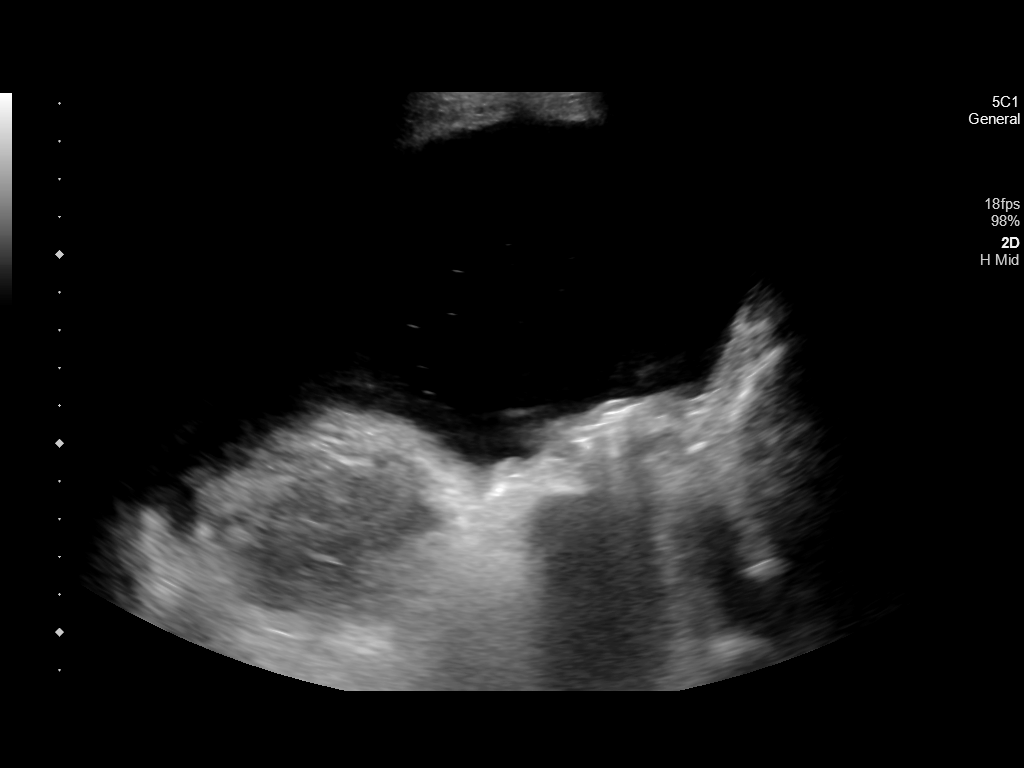

[4 of 4 positions shown; findings below may reference images not displayed]

EXAM:
ULTRASOUND GUIDED THORACENTESIS

MEDICATIONS:
1% lidocaine 10 mL

COMPLICATIONS:
None immediate.

PROCEDURE:
An ultrasound guided thoracentesis was thoroughly discussed with the
patient and questions answered. The benefits, risks, alternatives
and complications were also discussed. The patient understands and
wishes to proceed with the procedure. Written consent was obtained.

Ultrasound was performed to localize and mark an adequate pocket of
fluid in the right chest. The area was then prepped and draped in
the normal sterile fashion. 1% Lidocaine was used for local
anesthesia. Under ultrasound guidance a 6 Fr Safe-T-Centesis
catheter was introduced. Thoracentesis was performed. Procedure was
stopped early due to patient discomfort. The catheter was removed
and a dressing applied.
FINDINGS: A total of approximately 1.7 L of amber colored fluid was removed.
IMPRESSION: Successful ultrasound guided right thoracentesis yielding 1.7 L of
pleural fluid.

Read by: Caomhin Vevere, NP

## 2022-05-02 IMAGING — DX DG CHEST 1V PORT
1 series · 1 of 1 positions shown · non-contrast
Comparison: March 02, 2022.

CLINICAL DATA: Status post right thoracentesis.

EXAM:
PORTABLE CHEST 1 VIEW

[chest ap]
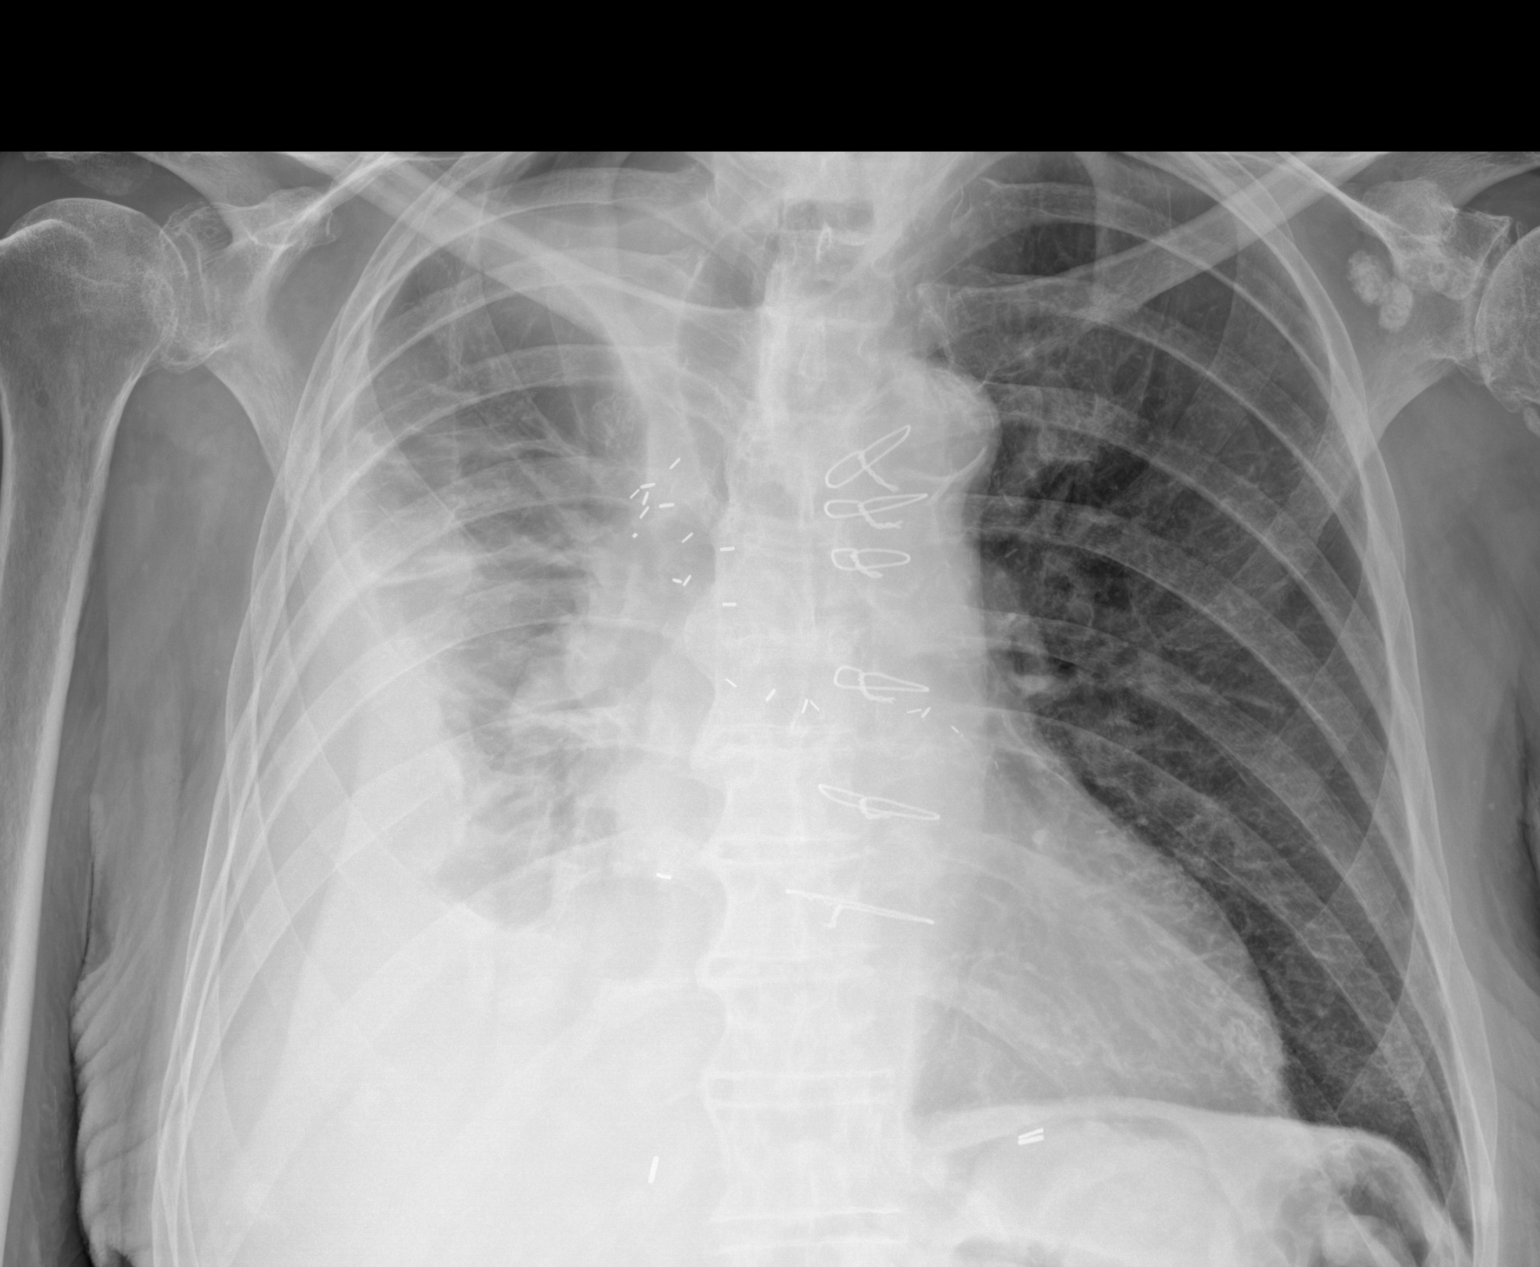

[1 of 1 positions shown; findings below may reference images not displayed]

FINDINGS: Right pleural effusion is smaller status post thoracentesis. No
definite pneumothorax is noted.
IMPRESSION: Right pleural effusion is smaller status post thoracentesis.

## 2022-05-04 LAB — MISC LABCORP TEST (SEND OUT)
LabCorp test name: 5367
Labcorp test code: 5367

## 2022-05-04 LAB — CYTOLOGY - NON PAP

## 2022-05-07 ENCOUNTER — Inpatient Hospital Stay: Admit: 2022-05-07 | Payer: Medicare Other

## 2022-05-27 NOTE — Progress Notes (Signed)
MRN : [id number]  Wesley Henderson is a 88 y.o. (09/14/1930) male who presents with chief complaint of check access.  History of Present Illness:   The patient returns to the office for followup of their dialysis access.    He is s/p left brachiocephalic AV fistula placement on 08/06/2021.     The function of the access has been stable. The patient denies hand pain or other symptoms consistent with steal phenomena.  No significant arm swelling.   The patient denies redness or swelling at the access site.    The patient denies amaurosis fugax or recent TIA symptoms. There are no recent neurological changes noted. The patient denies claudication symptoms or rest pain symptoms. The patient denies history of DVT, PE or superficial thrombophlebitis. The patient denies recent episodes of angina or shortness of breath.    Duplex ultrasound of the AV access shows a patent access.  Flow volume today is 1004 cc/min.  No hemodynamically significant stenosis is identified.  No significant change from previous study  No outpatient medications have been marked as taking for the 05/28/22 encounter (Appointment) with Delana Meyer, Dolores Lory, MD.    Past Medical History:  Diagnosis Date   Anemia of chronic renal disease    Anxiety    CAD (coronary artery disease)    CHF (congestive heart failure) ([place])    Diabetic glomerulopathy ([place])    ESRD (end stage renal disease) on dialysis [facility])    HD on MWF   Hx of CABG 09/1995   3 vessel   Hyperlipidemia    Hypertension    Secondary hyperparathyroidism of renal origin ([place])    Type II diabetes mellitus with nephropathy [facility])     Past Surgical History:  Procedure Laterality Date   AV FISTULA PLACEMENT Left 08/06/2021   Procedure: ARTERIOVENOUS (AV) FISTULA CREATION (BRACHIALCEPHALIC);  Surgeon: Katha Cabal, MD;  Location: [facility] ORS;  Service: Vascular;  Laterality: Left;   CAPD INSERTION N/A 03/04/2018   Procedure: LAPAROSCOPIC INSERTION  CONTINUOUS AMBULATORY PERITONEAL DIALYSIS  (CAPD) CATHETER;  Surgeon: Katha Cabal, MD;  Location: [facility] ORS;  Service: Vascular;  Laterality: N/A;   CAPD REVISION N/A 04/06/2018   Procedure: LAPAROSCOPIC REVISION CONTINUOUS AMBULATORY PERITONEAL DIALYSIS  (CAPD) CATHETER;  Surgeon: Katha Cabal, MD;  Location: [facility] ORS;  Service: Vascular;  Laterality: N/A;   CAPD REVISION  04/14/2021   CORONARY ARTERY BYPASS GRAFT  09/1995   3 vessels   DIALYSIS/PERMA CATHETER INSERTION N/A 02/07/2021   Procedure: DIALYSIS/PERMA CATHETER INSERTION;  Surgeon: Katha Cabal, MD;  Location: [facility];  Service: Cardiovascular;  Laterality: N/A;   DIALYSIS/PERMA CATHETER REMOVAL N/A 12/04/2021   Procedure: DIALYSIS/PERMA CATHETER REMOVAL;  Surgeon: Algernon Huxley, MD;  Location: [facility];  Service: Cardiovascular;  Laterality: N/A;   IR THORACENTESIS ASP PLEURAL SPACE W/IMG GUIDE  04/07/2022   PERITONEAL CATHETER REMOVAL  05/21/2021   PERITONEAL CATHETER REMOVAL  05/20/2018    Social History Social History   Tobacco Use   Smoking status: Former    Types: Cigars   Smokeless tobacco: Never  Vaping Use   Vaping Use: Never used  Substance Use Topics   Alcohol use: No   Drug use: No    Family History Family History  Problem Relation Age of Onset   Other Mother    Hypertension Mother     Allergies  Allergen Reactions   Hydralazine Swelling    Swelling hands feet and ankles  REVIEW OF SYSTEMS (Negative unless checked)  Constitutional: [] Weight loss  [] Fever  [] Chills Cardiac: [] Chest pain   [] Chest pressure   [] Palpitations   [] Shortness of breath when laying flat   [] Shortness of breath with exertion. Vascular:  [] Pain in legs with walking   [] Pain in legs at rest  [] History of DVT   [] Phlebitis   [] Swelling in legs   [] Varicose veins   [] Non-healing ulcers Pulmonary:   [] Uses home oxygen   [] Productive cough   [] Hemoptysis   [] Wheeze  [] COPD    [] Asthma Neurologic:  [] Dizziness   [] Seizures   [] History of stroke   [] History of TIA  [] Aphasia   [] Vissual changes   [] Weakness or numbness in arm   [] Weakness or numbness in leg Musculoskeletal:   [] Joint swelling   [] Joint pain   [] Low back pain Hematologic:  [] Easy bruising  [] Easy bleeding   [] Hypercoagulable state   [] Anemic Gastrointestinal:  [] Diarrhea   [] Vomiting  [] Gastroesophageal reflux/heartburn   [] Difficulty swallowing. Genitourinary:  [x] Chronic kidney disease   [] Difficult urination  [] Frequent urination   [] Blood in urine Skin:  [] Rashes   [] Ulcers  Psychological:  [] History of anxiety   []  History of major depression.  Physical Examination  There were no vitals filed for this visit. There is no height or weight on file to calculate BMI. Gen: WD/WN, NAD Head: [place]/AT, No temporalis wasting.  Ear/Nose/Throat: Hearing grossly intact, nares w/o erythema or drainage Eyes: PER, EOMI, sclera nonicteric.  Neck: Supple, no gross masses or lesions.  No JVD.  Pulmonary:  Good air movement, no audible wheezing, no use of accessory muscles.  Cardiac: RRR, precordium non-hyperdynamic. Vascular:   Left brachiocephalic fistula good thrill good bruit skin appears healthy Vessel Right Left  Radial Palpable Palpable  Brachial Palpable Palpable  Gastrointestinal: soft, non-distended. No guarding/no peritoneal signs.  Musculoskeletal: M/S 5/5 throughout.  No deformity.  Neurologic: CN 2-12 intact. Pain and light touch intact in extremities.  Symmetrical.  Speech is fluent. Motor exam as listed above. Psychiatric: Judgment intact, Mood & affect appropriate for pt's clinical situation. Dermatologic: No rashes or ulcers noted.  No changes consistent with cellulitis.   CBC Lab Results  Component Value Date   WBC 8.9 04/06/2022   HGB 11.7 (L) 04/06/2022   HCT 36.2 (L) 04/06/2022   MCV 100.0 04/06/2022   PLT 270 04/06/2022    BMET    Component Value Date/Time   NA 136 04/07/2022  0543   K 4.1 04/07/2022 0543   CL 99 04/07/2022 0543   CO2 26 04/07/2022 0543   GLUCOSE 90 04/07/2022 0543   BUN 54 (H) 04/07/2022 0543   CREATININE 4.18 (H) 04/07/2022 0543   CALCIUM 8.0 (L) 04/07/2022 0543   GFRNONAA 13 (L) 04/07/2022 0543   GFRAA 9 (L) 04/01/2018 1415   CrCl cannot be calculated (Patient's most recent lab result is older than the maximum 21 days allowed.).  COAG Lab Results  Component Value Date   INR 1.2 07/28/2021   INR 1.07 04/01/2018   INR 1.05 02/25/2018    Radiology DG Chest Port 1 View  Result Date: 04/30/2022 CLINICAL DATA:  Post thoracentesis. EXAM: PORTABLE CHEST 1 VIEW COMPARISON:  Imaging from April 17, 2022. FINDINGS: Postoperative changes of median sternotomy for CABG. Cardiomediastinal contours and hilar structures are stable. No visible pneumothorax. LEFT chest is clear. The degree/amount of aerated lung in the RIGHT chest is unchanged. Associated pleural fluid is similar from a radiographic standpoint to imaging from April 17, 2022. On limited assessment there is no acute skeletal finding. IMPRESSION: 1. No interval change in the appearance of the chest. No visible pneumothorax. 2. Persistent RIGHT effusion with basilar atelectasis. 3. Appearance of pleural fluid is compatible with loculation which may explain limited change . Electronically Signed   By: Zetta Bills M.D.   On: 04/30/2022 14:33   US THORACENTESIS ASP PLEURAL SPACE W/IMG GUIDE  Result Date: 04/30/2022 INDICATION: Congestive heart failure and ESRD with shortness of breath and recurrent right pleural effusion. Request for therapeutic right thoracentesis EXAM: ULTRASOUND GUIDED THERAPEUTIC RIGHT THORACENTESIS MEDICATIONS: 10 mL 1 % lidocaine COMPLICATIONS: None immediate. PROCEDURE: An ultrasound guided thoracentesis was thoroughly discussed with the patient and questions answered. The benefits, risks, alternatives and complications were also discussed. The patient understands and wishes  to proceed with the procedure. Written consent was obtained. Ultrasound was performed to localize and mark an adequate pocket of fluid in the right chest. The area was then prepped and draped in the normal sterile fashion. 1% Lidocaine was used for local anesthesia. Under ultrasound guidance a 6 Fr Safe-T-Centesis catheter was introduced. Thoracentesis was performed. The catheter was removed and a dressing applied. FINDINGS: A total of approximately 1 L of serosanguineous fluid was removed. IMPRESSION: Successful ultrasound guided right thoracentesis yielding 1 L of pleural fluid. Read by: Narda Rutherford, AGNP-BC Electronically Signed   By: Miachel Roux M.D.   On: 04/30/2022 14:48     Assessment/Plan 1. End stage renal disease ([place]) Recommend:  The patient is doing well and currently has adequate dialysis access. The patient's dialysis center is not reporting any access issues. Flow pattern is stable when compared to the prior ultrasound.  The patient should have a duplex ultrasound of the dialysis access in 6 months. The patient will follow-up with me in the office after each ultrasound    - VAS [place] (AVF, AVG); Future  2. Essential hypertension Continue antihypertensive medications as already ordered, these medications have been reviewed and there are no changes at this time.   3. Coronary artery disease of native artery of native heart with stable angina pectoris (HCC) Continue cardiac and antihypertensive medications as already ordered and reviewed, no changes at this time.  Continue statin as ordered and reviewed, no changes at this time  Nitrates PRN for chest pain   4. Atrial fibrillation and flutter ([place]) Continue antiarrhythmia medications as already ordered, these medications have been reviewed and there are no changes at this time.  Continue anticoagulation as ordered by Cardiology Service   5. Diabetes mellitus with ESRD (end-stage renal disease)  ([place]) Continue hypoglycemic medications as already ordered, these medications have been reviewed and there are no changes at this time.  Hgb A1C to be monitored as already arranged by primary service     Hortencia Pilar, MD  05/27/2022 9:10 PM

## 2022-05-28 ENCOUNTER — Ambulatory Visit (INDEPENDENT_AMBULATORY_CARE_PROVIDER_SITE_OTHER): Payer: Medicare Other

## 2022-05-28 ENCOUNTER — Ambulatory Visit (INDEPENDENT_AMBULATORY_CARE_PROVIDER_SITE_OTHER): Payer: Medicare Other | Admitting: Vascular Surgery

## 2022-05-28 ENCOUNTER — Encounter (INDEPENDENT_AMBULATORY_CARE_PROVIDER_SITE_OTHER): Payer: Self-pay | Admitting: Vascular Surgery

## 2022-05-28 VITALS — BP 100/64 | HR 102 | Resp 16 | Ht 68.0 in | Wt 148.0 lb

## 2022-05-28 DIAGNOSIS — I4891 Unspecified atrial fibrillation: Secondary | ICD-10-CM | POA: Diagnosis not present

## 2022-05-28 DIAGNOSIS — I10 Essential (primary) hypertension: Secondary | ICD-10-CM

## 2022-05-28 DIAGNOSIS — N186 End stage renal disease: Secondary | ICD-10-CM

## 2022-05-28 DIAGNOSIS — I4892 Unspecified atrial flutter: Secondary | ICD-10-CM

## 2022-05-28 DIAGNOSIS — I25118 Atherosclerotic heart disease of native coronary artery with other forms of angina pectoris: Secondary | ICD-10-CM | POA: Diagnosis not present

## 2022-05-28 DIAGNOSIS — E1122 Type 2 diabetes mellitus with diabetic chronic kidney disease: Secondary | ICD-10-CM

## 2022-06-03 ENCOUNTER — Other Ambulatory Visit: Payer: Self-pay | Admitting: Pulmonary Disease

## 2022-06-03 DIAGNOSIS — J90 Pleural effusion, not elsewhere classified: Secondary | ICD-10-CM

## 2022-06-04 ENCOUNTER — Ambulatory Visit
Admission: RE | Admit: 2022-06-04 | Discharge: 2022-06-04 | Disposition: A | Discharge status: Home / Self Care | Payer: Medicare Other | Source: Ambulatory Visit | Attending: Pulmonary Disease | Admitting: Pulmonary Disease

## 2022-06-04 ENCOUNTER — Ambulatory Visit
Admission: RE | Admit: 2022-06-04 | Discharge: 2022-06-04 | Disposition: A | Discharge status: Home / Self Care | Payer: Medicare Other | Source: Ambulatory Visit | Attending: Internal Medicine | Admitting: Internal Medicine

## 2022-06-04 ENCOUNTER — Other Ambulatory Visit: Payer: Self-pay | Admitting: Internal Medicine

## 2022-06-04 DIAGNOSIS — J90 Pleural effusion, not elsewhere classified: Secondary | ICD-10-CM

## 2022-06-04 DIAGNOSIS — Z9889 Other specified postprocedural states: Secondary | ICD-10-CM

## 2022-06-04 DIAGNOSIS — R0602 Shortness of breath: Secondary | ICD-10-CM | POA: Insufficient documentation

## 2022-06-04 DIAGNOSIS — I509 Heart failure, unspecified: Secondary | ICD-10-CM | POA: Insufficient documentation

## 2022-06-04 DIAGNOSIS — N186 End stage renal disease: Secondary | ICD-10-CM | POA: Diagnosis not present

## 2022-06-04 LAB — BODY FLUID CELL COUNT WITH DIFFERENTIAL
Eos, Fluid: 0 %
Lymphs, Fluid: 87 %
Monocyte-Macrophage-Serous Fluid: 11 %
Neutrophil Count, Fluid: 2 %
Total Nucleated Cell Count, Fluid: 1368 cu mm

## 2022-06-04 LAB — GLUCOSE, PLEURAL OR PERITONEAL FLUID: Glucose, Fluid: 180 mg/dL

## 2022-06-04 LAB — PROTEIN, PLEURAL OR PERITONEAL FLUID: Total protein, fluid: 3.1 g/dL

## 2022-06-04 LAB — LACTATE DEHYDROGENASE, PLEURAL OR PERITONEAL FLUID: LD, Fluid: 171 U/L — ABNORMAL HIGH (ref 3–23)

## 2022-06-04 LAB — AMYLASE, PLEURAL OR PERITONEAL FLUID: Amylase, Fluid: 12 U/L

## 2022-06-04 NOTE — Procedures (Signed)
PROCEDURE SUMMARY:  Successful US guided diagnostic and therapeutic right thoracentesis. Yielded 900 cc of hazy, brown fluid. Pt tolerated procedure well. No immediate complications.  Specimen was sent for labs. CXR ordered.  EBL < 1 mL  Tyson Alias, AGNP 06/04/2022 3:22 PM

## 2022-06-06 IMAGING — DX DG CHEST 1V PORT
1 series · 1 of 1 positions shown · non-contrast
Comparison: Radiograph 04/07/2022

CLINICAL DATA: S/P thoracentesis

EXAM:
PORTABLE CHEST 1 VIEW

[chest ap]
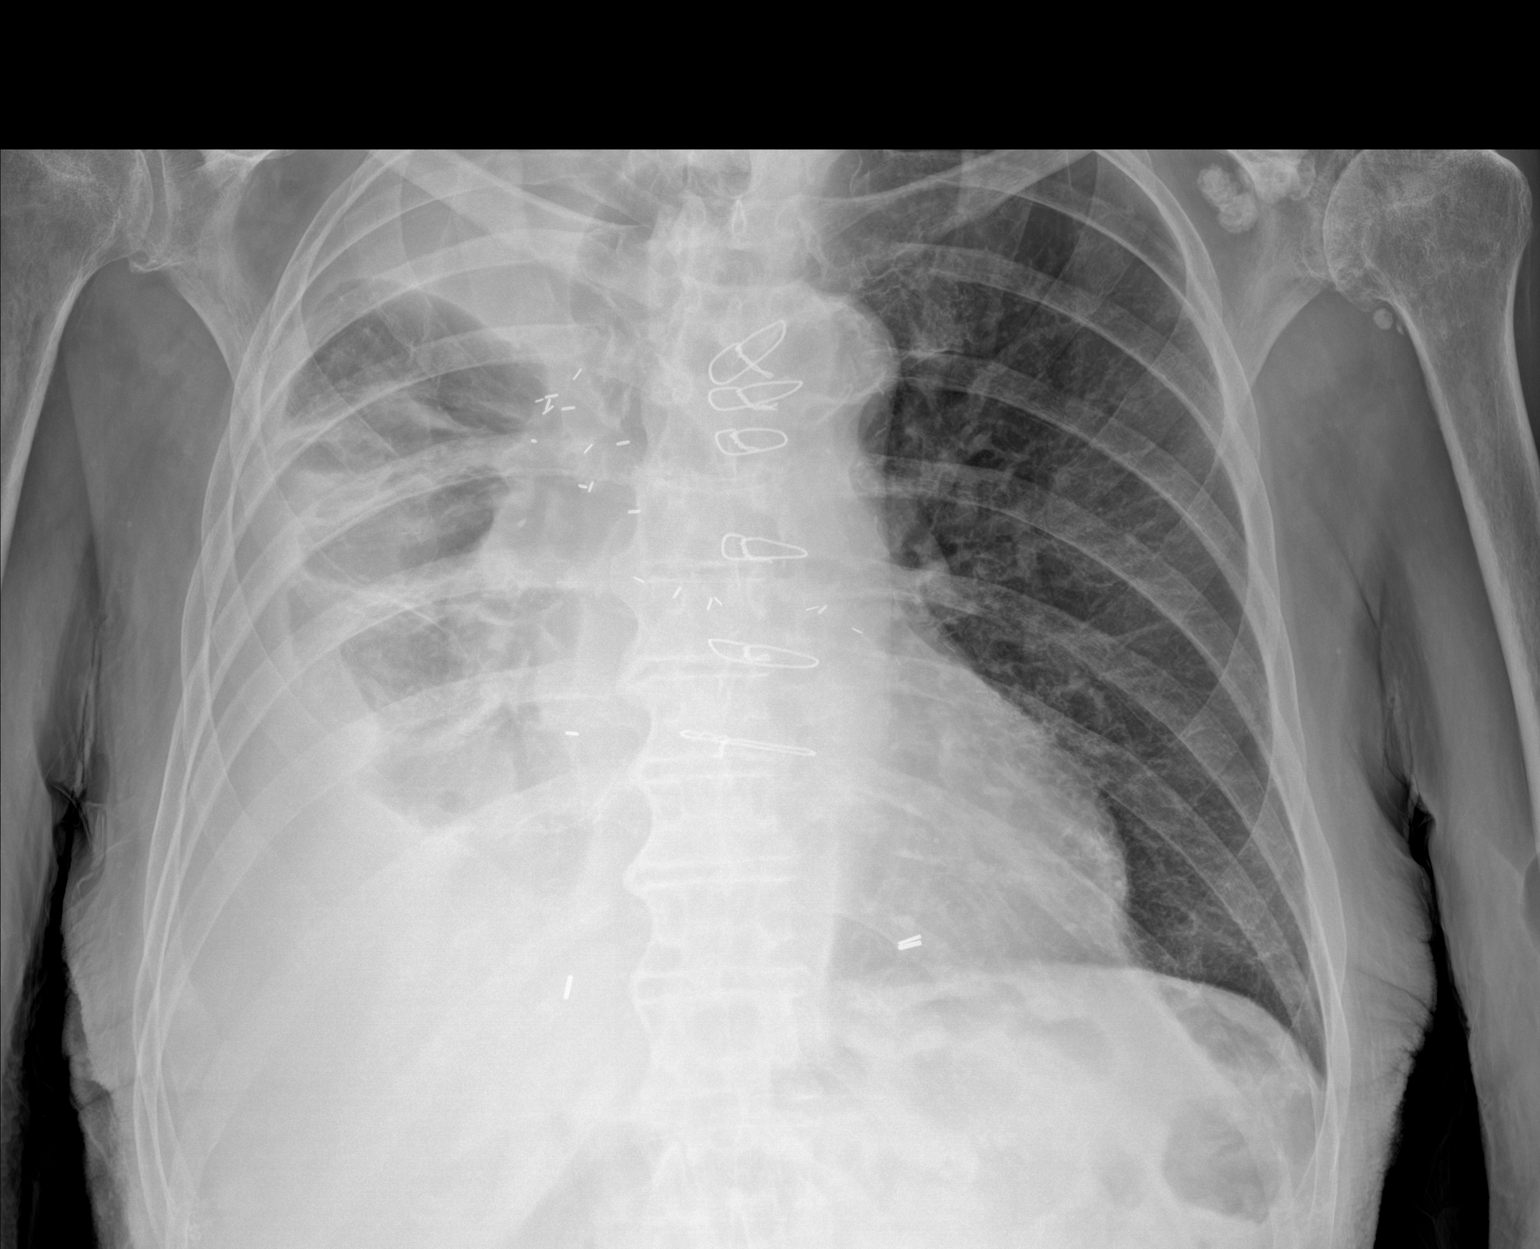

[1 of 1 positions shown; findings below may reference images not displayed]

FINDINGS: Unchanged cardiomediastinal silhouette. Persistent large right
pleural effusion. No visible pneumothorax. Left lung is clear.
Unchanged glenohumeral osteoarthritis with multiple ossified joint
bodies along the left glenohumeral joint.
IMPRESSION: Persistent large right pleural effusion.  No visible pneumothorax.

## 2022-06-07 LAB — CHOLESTEROL, BODY FLUID: Cholesterol, Fluid: 32 mg/dL

## 2022-06-07 LAB — ACID FAST SMEAR (AFB, MYCOBACTERIA): Acid Fast Smear: NEGATIVE

## 2022-06-08 LAB — BODY FLUID CULTURE W GRAM STAIN
Culture: NO GROWTH
Gram Stain: NONE SEEN

## 2022-06-08 LAB — CYTOLOGY - NON PAP

## 2022-06-15 LAB — MISC LABCORP TEST (SEND OUT): Labcorp test code: 5367

## 2022-06-18 ENCOUNTER — Ambulatory Visit
Admission: RE | Admit: 2022-06-18 | Discharge: 2022-06-18 | Disposition: A | Discharge status: Home / Self Care | Payer: Medicare Other | Source: Ambulatory Visit | Attending: Physician Assistant | Admitting: Physician Assistant

## 2022-06-18 ENCOUNTER — Other Ambulatory Visit: Payer: Self-pay | Admitting: Pulmonary Disease

## 2022-06-18 ENCOUNTER — Other Ambulatory Visit: Payer: Self-pay | Admitting: Physician Assistant

## 2022-06-18 ENCOUNTER — Ambulatory Visit
Admission: RE | Admit: 2022-06-18 | Discharge: 2022-06-18 | Disposition: A | Discharge status: Home / Self Care | Payer: Medicare Other | Source: Ambulatory Visit | Attending: Pulmonary Disease | Admitting: Pulmonary Disease

## 2022-06-18 DIAGNOSIS — Z9889 Other specified postprocedural states: Secondary | ICD-10-CM | POA: Insufficient documentation

## 2022-06-18 DIAGNOSIS — J90 Pleural effusion, not elsewhere classified: Secondary | ICD-10-CM

## 2022-06-18 NOTE — Procedures (Signed)
PROCEDURE SUMMARY:  Successful US guided right thoracentesis. Yielded 1L of this serosanguinous fluid. Pt tolerated procedure well. No immediate complications.  Specimen not sent for labs. CXR ordered.  EBL < 5 mL  Danese Dorsainvil PA-C 06/18/2022 3:54 PM

## 2022-07-06 LAB — FUNGUS CULTURE WITH STAIN

## 2022-07-06 LAB — FUNGUS CULTURE RESULT

## 2022-07-06 LAB — FUNGAL ORGANISM REFLEX

## 2022-07-10 ENCOUNTER — Other Ambulatory Visit: Payer: Self-pay | Admitting: Pulmonary Disease

## 2022-07-10 DIAGNOSIS — J90 Pleural effusion, not elsewhere classified: Secondary | ICD-10-CM

## 2022-07-14 ENCOUNTER — Ambulatory Visit
Admission: RE | Admit: 2022-07-14 | Discharge: 2022-07-14 | Disposition: A | Discharge status: Home / Self Care | Payer: Medicare Other | Source: Ambulatory Visit | Attending: Radiology | Admitting: Radiology

## 2022-07-14 ENCOUNTER — Ambulatory Visit
Admission: RE | Admit: 2022-07-14 | Discharge: 2022-07-14 | Disposition: A | Discharge status: Home / Self Care | Payer: Medicare Other | Source: Ambulatory Visit | Attending: Pulmonary Disease | Admitting: Pulmonary Disease

## 2022-07-14 ENCOUNTER — Other Ambulatory Visit: Payer: Self-pay | Admitting: Radiology

## 2022-07-14 DIAGNOSIS — J90 Pleural effusion, not elsewhere classified: Secondary | ICD-10-CM

## 2022-07-14 DIAGNOSIS — Z9889 Other specified postprocedural states: Secondary | ICD-10-CM

## 2022-07-14 NOTE — Procedures (Signed)
PROCEDURE SUMMARY:  Successful US guided right thoracentesis. Loculated effusion seen. Yielded 1.2 L of dark bloody fluid. Pt tolerated procedure well. No immediate complications.  Specimen was not sent for labs. CXR ordered.  EBL < 5 mL  Rockney Ghee 07/14/2022 3:41 PM

## 2022-07-16 ENCOUNTER — Ambulatory Visit: Payer: Medicare Other

## 2022-07-21 LAB — ACID FAST CULTURE WITH REFLEXED SENSITIVITIES (MYCOBACTERIA): Acid Fast Culture: NEGATIVE

## 2022-07-24 ENCOUNTER — Other Ambulatory Visit: Payer: Self-pay | Admitting: Pulmonary Disease

## 2022-07-24 ENCOUNTER — Ambulatory Visit
Admission: RE | Admit: 2022-07-24 | Discharge: 2022-07-24 | Disposition: A | Discharge status: Home / Self Care | Payer: Medicare Other | Source: Ambulatory Visit | Attending: Pulmonary Disease | Admitting: Pulmonary Disease

## 2022-07-24 ENCOUNTER — Ambulatory Visit
Admission: RE | Admit: 2022-07-24 | Discharge: 2022-07-24 | Disposition: A | Discharge status: Home / Self Care | Payer: Medicare Other | Source: Ambulatory Visit | Attending: Radiology | Admitting: Radiology

## 2022-07-24 DIAGNOSIS — N186 End stage renal disease: Secondary | ICD-10-CM | POA: Insufficient documentation

## 2022-07-24 DIAGNOSIS — I2510 Atherosclerotic heart disease of native coronary artery without angina pectoris: Secondary | ICD-10-CM | POA: Insufficient documentation

## 2022-07-24 DIAGNOSIS — Z951 Presence of aortocoronary bypass graft: Secondary | ICD-10-CM | POA: Insufficient documentation

## 2022-07-24 DIAGNOSIS — J90 Pleural effusion, not elsewhere classified: Secondary | ICD-10-CM

## 2022-07-24 DIAGNOSIS — Z7901 Long term (current) use of anticoagulants: Secondary | ICD-10-CM | POA: Diagnosis not present

## 2022-07-24 DIAGNOSIS — Z992 Dependence on renal dialysis: Secondary | ICD-10-CM | POA: Insufficient documentation

## 2022-07-24 DIAGNOSIS — I4891 Unspecified atrial fibrillation: Secondary | ICD-10-CM | POA: Insufficient documentation

## 2022-07-24 NOTE — Procedures (Signed)
Ultrasound-guided therapeutic right sided thoracentesis performed yielding 1.2 liters of serosanguinous colored fluid. No immediate complications. . Follow-up chest x-ray pending. EBL is < 2 ml.

## 2022-08-06 ENCOUNTER — Ambulatory Visit
Admission: RE | Admit: 2022-08-06 | Discharge: 2022-08-06 | Disposition: A | Discharge status: Home / Self Care | Payer: Medicare Other | Source: Ambulatory Visit | Attending: Radiology | Admitting: Radiology

## 2022-08-06 ENCOUNTER — Other Ambulatory Visit: Payer: Self-pay | Admitting: Radiology

## 2022-08-06 ENCOUNTER — Ambulatory Visit
Admission: RE | Admit: 2022-08-06 | Discharge: 2022-08-06 | Disposition: A | Discharge status: Home / Self Care | Payer: Medicare Other | Source: Ambulatory Visit | Attending: Pulmonary Disease | Admitting: Pulmonary Disease

## 2022-08-06 DIAGNOSIS — Z9889 Other specified postprocedural states: Secondary | ICD-10-CM

## 2022-08-06 DIAGNOSIS — J90 Pleural effusion, not elsewhere classified: Secondary | ICD-10-CM | POA: Insufficient documentation

## 2022-08-20 ENCOUNTER — Ambulatory Visit: Admission: RE | Admit: 2022-08-20 | Payer: Medicare Other | Source: Ambulatory Visit

## 2022-08-20 ENCOUNTER — Other Ambulatory Visit: Payer: Self-pay | Admitting: Pulmonary Disease

## 2022-08-20 DIAGNOSIS — J90 Pleural effusion, not elsewhere classified: Secondary | ICD-10-CM

## 2022-08-21 DEATH — deceased

## 2022-11-26 ENCOUNTER — Encounter (INDEPENDENT_AMBULATORY_CARE_PROVIDER_SITE_OTHER): Payer: Medicare Other

## 2022-11-26 ENCOUNTER — Ambulatory Visit (INDEPENDENT_AMBULATORY_CARE_PROVIDER_SITE_OTHER): Payer: Medicare Other | Admitting: Vascular Surgery
